# Patient Record
Sex: Female | Born: 1960 | Race: Black or African American | Hispanic: No | Marital: Married | Smoking: Never smoker
Health system: Southern US, Community
[De-identification: ages and names within clinical notes are randomized; demographics above are authoritative.]

## PROBLEM LIST (undated history)

## (undated) DIAGNOSIS — G5600 Carpal tunnel syndrome, unspecified upper limb: Secondary | ICD-10-CM

## (undated) DIAGNOSIS — E119 Type 2 diabetes mellitus without complications: Secondary | ICD-10-CM

## (undated) DIAGNOSIS — F419 Anxiety disorder, unspecified: Secondary | ICD-10-CM

## (undated) DIAGNOSIS — I10 Essential (primary) hypertension: Secondary | ICD-10-CM

## (undated) DIAGNOSIS — M1990 Unspecified osteoarthritis, unspecified site: Secondary | ICD-10-CM

## (undated) DIAGNOSIS — K219 Gastro-esophageal reflux disease without esophagitis: Secondary | ICD-10-CM

## (undated) HISTORY — PX: COLONOSCOPY: SHX174

## (undated) HISTORY — PX: JOINT REPLACEMENT: SHX530

## (undated) HISTORY — PX: ABDOMINAL HYSTERECTOMY: SHX81

## (undated) HISTORY — PX: DILATION AND CURETTAGE OF UTERUS: SHX78

---

## 1998-04-12 ENCOUNTER — Other Ambulatory Visit: Admission: RE | Admit: 1998-04-12 | Discharge: 1998-04-12 | Payer: Self-pay | Admitting: Obstetrics & Gynecology

## 1998-04-25 ENCOUNTER — Ambulatory Visit (HOSPITAL_COMMUNITY): Admission: RE | Admit: 1998-04-25 | Discharge: 1998-04-25 | Payer: Self-pay | Admitting: Family Medicine

## 1999-04-16 ENCOUNTER — Other Ambulatory Visit: Admission: RE | Admit: 1999-04-16 | Discharge: 1999-04-16 | Payer: Self-pay | Admitting: Obstetrics & Gynecology

## 2000-05-09 ENCOUNTER — Other Ambulatory Visit: Admission: RE | Admit: 2000-05-09 | Discharge: 2000-05-09 | Payer: Self-pay | Admitting: Obstetrics & Gynecology

## 2001-06-29 ENCOUNTER — Other Ambulatory Visit: Admission: RE | Admit: 2001-06-29 | Discharge: 2001-06-29 | Payer: Self-pay | Admitting: Obstetrics & Gynecology

## 2001-11-12 ENCOUNTER — Observation Stay (HOSPITAL_COMMUNITY): Admission: RE | Admit: 2001-11-12 | Discharge: 2001-11-14 | Payer: Self-pay | Admitting: Podiatry

## 2002-07-21 ENCOUNTER — Other Ambulatory Visit: Admission: RE | Admit: 2002-07-21 | Discharge: 2002-07-21 | Payer: Self-pay | Admitting: Obstetrics & Gynecology

## 2003-09-05 ENCOUNTER — Other Ambulatory Visit: Admission: RE | Admit: 2003-09-05 | Discharge: 2003-09-05 | Payer: Self-pay | Admitting: Obstetrics and Gynecology

## 2004-09-29 ENCOUNTER — Emergency Department (HOSPITAL_COMMUNITY): Admission: EM | Admit: 2004-09-29 | Discharge: 2004-09-29 | Payer: Self-pay | Admitting: Emergency Medicine

## 2004-10-04 ENCOUNTER — Other Ambulatory Visit: Admission: RE | Admit: 2004-10-04 | Discharge: 2004-10-04 | Payer: Self-pay | Admitting: Obstetrics & Gynecology

## 2005-10-23 ENCOUNTER — Other Ambulatory Visit: Admission: RE | Admit: 2005-10-23 | Discharge: 2005-10-23 | Payer: Self-pay | Admitting: Obstetrics & Gynecology

## 2014-02-19 ENCOUNTER — Encounter (HOSPITAL_COMMUNITY): Payer: Self-pay | Admitting: Emergency Medicine

## 2014-02-19 ENCOUNTER — Emergency Department (HOSPITAL_COMMUNITY)
Admission: EM | Admit: 2014-02-19 | Discharge: 2014-02-19 | Disposition: A | Discharge status: Home / Self Care | Payer: BC Managed Care – PPO | Attending: Emergency Medicine | Admitting: Emergency Medicine

## 2014-02-19 DIAGNOSIS — E119 Type 2 diabetes mellitus without complications: Secondary | ICD-10-CM | POA: Insufficient documentation

## 2014-02-19 DIAGNOSIS — Z79899 Other long term (current) drug therapy: Secondary | ICD-10-CM | POA: Insufficient documentation

## 2014-02-19 DIAGNOSIS — I10 Essential (primary) hypertension: Secondary | ICD-10-CM | POA: Insufficient documentation

## 2014-02-19 DIAGNOSIS — G5600 Carpal tunnel syndrome, unspecified upper limb: Secondary | ICD-10-CM | POA: Insufficient documentation

## 2014-02-19 DIAGNOSIS — R209 Unspecified disturbances of skin sensation: Secondary | ICD-10-CM | POA: Insufficient documentation

## 2014-02-19 DIAGNOSIS — M109 Gout, unspecified: Secondary | ICD-10-CM

## 2014-02-19 HISTORY — DX: Type 2 diabetes mellitus without complications: E11.9

## 2014-02-19 HISTORY — DX: Essential (primary) hypertension: I10

## 2014-02-19 HISTORY — DX: Carpal tunnel syndrome, unspecified upper limb: G56.00

## 2014-02-19 MED ORDER — PREDNISONE 20 MG PO TABS: 40.0000 mg | ORAL_TABLET | Freq: Every day | ORAL | Status: DC

## 2014-02-19 MED ORDER — HYDROCODONE-ACETAMINOPHEN 5-325 MG PO TABS
ORAL_TABLET | ORAL | Status: DC
Start: 2014-02-19 — End: 2017-04-02

## 2014-02-19 MED ORDER — KETOROLAC TROMETHAMINE 30 MG/ML IJ SOLN
30.0000 mg | Freq: Once | INTRAMUSCULAR | Status: AC
  Administered 2014-02-19: 30 mg via INTRAMUSCULAR
  Filled 2014-02-19: qty 1

## 2014-02-19 NOTE — ED Notes (Signed)
Pt from home c/o right hand/wrist pain that began this am that woke her from her sleep. HX of carpal tunnel. She reports that she can't make a fist. Family HX of gout.

## 2014-02-19 NOTE — Discharge Instructions (Signed)
Narcotic and benzodiazepine use may cause drowsiness, slowed breathing or dependence.  Please use with caution and do not drive, operate machinery or watch young children alone while taking them.  Taking combinations of these medications or drinking alcohol will potentiate these effects.      Prednisone is a steroid which can elevated blood sugars transiently.  If you have diabetes, please be sure to monitor your blood sugars more frequently and discontinue use if blood sugars begin to rise and is not controlled by your medications and contact your physician.

## 2014-02-19 NOTE — ED Provider Notes (Signed)
CSN: [id number]     Arrival date & time 02/19/14  0736 History   First MD Initiated Contact with Patient 02/19/14 [phone]     Chief Complaint  Patient presents with  . Hand Pain     (Consider location/radiation/quality/duration/timing/severity/associated sxs/prior Treatment) HPI Comments: Pt's sister and mother have had gout as well.  Pain was rather sudden that woke her around 0100.  She is right handed, doesn't recall any specific injuries.  She works with right hand a lot however, typing and has had carpal tunnel in the past, has been wearing her brace this AM with no relief.    Patient is a 53 y.o. female presenting with hand pain. The history is provided by the patient.  Hand Pain This is a new problem. The current episode started 6 to 12 hours ago. The problem occurs constantly. The problem has not changed since onset.The symptoms are aggravated by bending (movement, palpation, light touch). Nothing relieves the symptoms. Treatments tried: NSAIDs, Flexeril. The treatment provided no relief.    Past Medical History  Diagnosis Date  . Carpal tunnel syndrome   . Diabetes mellitus without complication   . Hypertension    Past Surgical History  Procedure Laterality Date  . Abdominal hysterectomy    . Cesarean section    . Dilation and curettage of uterus     History reviewed. No pertinent family history. History  Substance Use Topics  . Smoking status: Never Smoker   . Smokeless tobacco: Not on file  . Alcohol Use: 1.8 oz/week    3 Glasses of wine per week     Comment: everyday   OB History   Grav Para Term Preterm Abortions TAB SAB Ect Mult Living                 Review of Systems  Constitutional: Negative for fever and chills.  Musculoskeletal: Positive for arthralgias.  Skin: Negative for color change and wound.  Neurological: Positive for numbness. Negative for weakness.      Allergies  Lisinopril and Keflex  Home Medications   Current Outpatient Rx  Name   Route  Sig  Dispense  Refill  . ALPRAZolam (XANAX) 0.5 MG tablet   Oral   Take 0.5 mg by mouth daily as needed for anxiety.         Marland Kitchen amLODipine (NORVASC) 10 MG tablet   Oral   Take 10 mg by mouth daily.         . Cholecalciferol (VITAMIN D3) 5000 UNITS CAPS   Oral   Take 1 capsule by mouth daily.         . cyclobenzaprine (FLEXERIL) 10 MG tablet   Oral   Take 10 mg by mouth 3 (three) times daily as needed for muscle spasms.         Marland Kitchen estradiol (ESTRACE) 2 MG tablet   Oral   Take 2 mg by mouth daily.         . hydrochlorothiazide (HYDRODIURIL) 25 MG tablet   Oral   Take 25 mg by mouth daily.         . metFORMIN (GLUCOPHAGE) 500 MG tablet   Oral   Take 500 mg by mouth daily with breakfast.         . Multiple Vitamin (MULTIVITAMIN WITH MINERALS) TABS tablet   Oral   Take 2 tablets by mouth daily.         . vitamin B-12 (CYANOCOBALAMIN) 1000 MCG tablet   Oral  Take 1,000 mcg by mouth daily.         Marland Kitchen. HYDROcodone-acetaminophen (NORCO/VICODIN) 5-325 MG per tablet      1-2 tablets po q 6 hours prn moderate to severe pain   20 tablet   0   . predniSONE (DELTASONE) 20 MG tablet   Oral   Take 2 tablets (40 mg total) by mouth daily.   10 tablet   0    BP 141/90  Pulse 95  Temp(Src) 98.8 F (37.1 C) (Oral)  Resp 16  SpO2 100% Physical Exam  Nursing note and vitals reviewed. Constitutional: She appears well-developed and well-nourished.  Eyes: Conjunctivae are normal. No scleral icterus.  Cardiovascular:  Pulses:      Radial pulses are 2+ on the right side, and 2+ on the left side.  Musculoskeletal:       Right wrist: She exhibits decreased range of motion, tenderness, bony tenderness and swelling. She exhibits no effusion, no crepitus, no deformity and no laceration.       Hands: Neurological: She is alert.  Skin: Skin is warm. No rash noted. No pallor.  Psychiatric: She has a normal mood and affect.    ED Course  Procedures (including  critical care time) Labs Review Labs Reviewed - No data to display Imaging Review No results found.   EKG Interpretation None       RA sat is 100% and I interpret to be normal   MDM   Final diagnoses:  Gout of hand    Pt with rather acute onset of pain with strong family h/o gout in women with atraumatic pain.  Good pulses, mild subjective paresthesias along index finger only.  Will treat with NSAID's here, pt drove, will give Rx for steroids and analgesics at home.     Pt initially informed me she had no PMH other than carpal tunnel.  Pt takes norvasc and metformin, and does have type 2 DM.  Pt is instructed that prednisone can cause transient elevation in glucose's and to discontinue use if glucose is harder to control   Gavin PoundMichael Y. Oletta LamasGhim, MD 02/19/14 [phone]

## 2014-08-18 ENCOUNTER — Other Ambulatory Visit: Payer: Self-pay | Admitting: Obstetrics & Gynecology

## 2014-08-19 LAB — CYTOLOGY - PAP

## 2015-08-21 ENCOUNTER — Other Ambulatory Visit: Payer: Self-pay | Admitting: Obstetrics & Gynecology

## 2015-08-22 LAB — CYTOLOGY - PAP

## 2017-04-02 ENCOUNTER — Ambulatory Visit (INDEPENDENT_AMBULATORY_CARE_PROVIDER_SITE_OTHER): Payer: Self-pay

## 2017-04-02 ENCOUNTER — Encounter (INDEPENDENT_AMBULATORY_CARE_PROVIDER_SITE_OTHER): Payer: Self-pay | Admitting: Orthopedic Surgery

## 2017-04-02 ENCOUNTER — Ambulatory Visit (INDEPENDENT_AMBULATORY_CARE_PROVIDER_SITE_OTHER): Payer: BLUE CROSS/BLUE SHIELD | Admitting: Orthopedic Surgery

## 2017-04-02 DIAGNOSIS — G8929 Other chronic pain: Secondary | ICD-10-CM

## 2017-04-02 DIAGNOSIS — M25562 Pain in left knee: Secondary | ICD-10-CM

## 2017-04-02 MED ORDER — METHYLPREDNISOLONE ACETATE 40 MG/ML IJ SUSP
40.0000 mg | INTRAMUSCULAR | Status: AC | PRN
  Administered 2017-04-02: 40 mg via INTRA_ARTICULAR

## 2017-04-02 MED ORDER — LIDOCAINE HCL 1 % IJ SOLN
5.0000 mL | INTRAMUSCULAR | Status: AC | PRN
  Administered 2017-04-02: 5 mL

## 2017-04-02 MED ORDER — BUPIVACAINE HCL 0.25 % IJ SOLN
4.0000 mL | INTRAMUSCULAR | Status: AC | PRN
  Administered 2017-04-02: 4 mL via INTRA_ARTICULAR

## 2017-04-02 NOTE — Progress Notes (Signed)
Office Visit Note   Patient: Raven Shaw           Date of Birth: 08-29-1961           MRN: [id number] Visit Date: 04/02/2017 Requested by: Farris Has, MD [address] [place], [place] [address] PCP: Farris Has, MD  Subjective: Chief Complaint  Patient presents with  . 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  She reports about a month of pain with no discrete injury.  She's had injections in the past.  She denies any significant mechanical symptoms but does report swelling.  The pain will occasionally wake her from sleep at night.  She wants to try to do some exercises.  She's been taking Tylenol, Tylenol arthritis, as well as other anti-inflammatories with some relief.              ROS: All systems reviewed are negative as they relate to the chief complaint within the history of present illness.  Patient denies  fevers or chills.   Assessment & Plan: Visit Diagnoses:  1. Chronic pain of left knee     Plan: Impression is left knee arthritis flare with effusion.  Plan is aspiration and injection with nonweightbearing quad strengthening exercises.  She may come to knee replacement in the future but for now I think this is an exacerbation of her symptoms.  I think occasional anti-inflammatories are indicated.  I will see her back as needed.  Follow-Up Instructions: No Follow-up on file.   Orders:  Orders Placed This Encounter  Procedures  . XR KNEE 3 VIEW LEFT   No orders of the defined types were placed in this encounter.     Procedures: Large Joint Inj Date/Time: 04/02/2017 3:28 PM Performed by: Cammy Copa Authorized by: Cammy Copa   Consent Given by:  Patient Site marked: the procedure site was marked   Timeout: prior to procedure the correct patient, procedure, and site was verified   Indications:  Pain, joint swelling and diagnostic evaluation Location:  Knee Site:  L  knee Prep: patient was prepped and draped in usual sterile fashion   Needle Size:  18 G Needle Length:  1.5 inches Approach:  Superolateral Ultrasound Guidance: No   Fluoroscopic Guidance: No   Arthrogram: No   Medications:  5 mL lidocaine 1 %; 4 mL bupivacaine 0.25 %; 40 mg methylPREDNISolone acetate 40 MG/ML Aspirate amount (mL):  30 Aspirate:  Yellow Patient tolerance:  Patient tolerated the procedure well with no immediate complications     Clinical Data: No additional findings.  Objective: Vital Signs: There were no vitals taken for this visit.  Physical Exam:   Constitutional: Patient appears well-developed HEENT:  Head: Normocephalic Eyes:EOM are normal Neck: Normal range of motion Cardiovascular: Normal rate Pulmonary/chest: Effort normal Neurologic: Patient is alert Skin: Skin is warm Psychiatric: Patient has normal mood and affect    Ortho Exam: Orthopedic exam demonstrates slightly antalgic gait to the left.  Patient has moderate effusion in the left knee.  Pedal pulses palpable.  Alignment is intact.  No groin pain with internal/external rotation leg.  No other masses lymph adenopathy or skin changes noted in the left knee region.  There is lateral greater than medial joint line tenderness.  Also patient has fairly significant patellofemoral crepitus more on the left than the right  Specialty Comments:  No specialty comments available.  Imaging: Xr Knee  3 View Left  Result Date: 04/02/2017 AP lateral merchant view left knee reviewed.  Patellofemoral arthritis with medial facet spurring is noted on the merchant view.  Lateral patellofemoral joint space is maintained.  On the lateral view there is spurring on the posterior aspect of the lateral femoral condyle along with possible ossified loose body posteriorly.  Patellofemoral spurring also noted.  On the AP view there is spurring medially and laterally with some joint space narrowing on that lateral side more  on the left knee than the right    PMFS History: There are no active problems to display for this patient.  Past Medical History:  Diagnosis Date  . Carpal tunnel syndrome   . Diabetes mellitus without complication (HCC)   . Hypertension     No family history on file.  Past Surgical History:  Procedure Laterality Date  . ABDOMINAL HYSTERECTOMY    . CESAREAN SECTION    . DILATION AND CURETTAGE OF UTERUS     Social History   Occupational History  . Not on file.   Social History Main Topics  . Smoking status: Never Smoker  . Smokeless tobacco: Never Used  . Alcohol use 1.8 oz/week    3 Glasses of wine per week     Comment: everyday  . Drug use: Unknown  . Sexual activity: Not on file

## 2017-04-03 ENCOUNTER — Telehealth (INDEPENDENT_AMBULATORY_CARE_PROVIDER_SITE_OTHER): Payer: Self-pay

## 2017-04-03 NOTE — Telephone Encounter (Signed)
LM for patient to call and schedule appt for synvisc injection with Dr August Saucer

## 2017-04-08 ENCOUNTER — Telehealth (INDEPENDENT_AMBULATORY_CARE_PROVIDER_SITE_OTHER): Payer: Self-pay | Admitting: Orthopedic Surgery

## 2017-04-08 NOTE — Telephone Encounter (Signed)
Patient called advised her Lt knee is still hurting and want to know if she can get the Gel injection as soon as possible. The number to contact patient is [phone]

## 2017-04-09 ENCOUNTER — Encounter (INDEPENDENT_AMBULATORY_CARE_PROVIDER_SITE_OTHER): Payer: Self-pay | Admitting: Orthopedic Surgery

## 2017-04-09 ENCOUNTER — Ambulatory Visit (INDEPENDENT_AMBULATORY_CARE_PROVIDER_SITE_OTHER): Payer: BLUE CROSS/BLUE SHIELD | Admitting: Orthopedic Surgery

## 2017-04-09 DIAGNOSIS — M1712 Unilateral primary osteoarthritis, left knee: Secondary | ICD-10-CM | POA: Diagnosis not present

## 2017-04-09 MED ORDER — LIDOCAINE HCL 1 % IJ SOLN
5.0000 mL | INTRAMUSCULAR | Status: AC | PRN
  Administered 2017-04-09: 5 mL

## 2017-04-09 MED ORDER — HYLAN G-F 20 48 MG/6ML IX SOSY
48.0000 mg | PREFILLED_SYRINGE | INTRA_ARTICULAR | Status: AC | PRN
  Administered 2017-04-09: 48 mg via INTRA_ARTICULAR

## 2017-04-09 NOTE — Telephone Encounter (Signed)
APPT SCHEDULED

## 2017-04-09 NOTE — Progress Notes (Signed)
   Procedure Note  Patient: Raven Shaw             Date of Birth: 10-04-1961           MRN: [id number]             Visit Date: 04/09/2017  Procedures: Visit Diagnoses: Unilateral primary osteoarthritis, left knee  Large Joint Inj Date/Time: 04/09/2017 12:23 PM Performed by: Cammy Copa Authorized by: Cammy Copa   Consent Given by:  Patient Site marked: the procedure site was marked   Timeout: prior to procedure the correct patient, procedure, and site was verified   Indications:  Pain, joint swelling and diagnostic evaluation Location:  Knee Site:  L knee Prep: patient was prepped and draped in usual sterile fashion   Needle Size:  18 G Needle Length:  1.5 inches Approach:  Superolateral Ultrasound Guidance: No   Fluoroscopic Guidance: No   Arthrogram: No   Medications:  5 mL lidocaine 1 %; 48 mg Hylan 48 MG/6ML Aspiration Attempted: Yes   Aspirate amount (mL):  25 Aspirate:  Yellow Patient tolerance:  Patient tolerated the procedure well with no immediate complications

## 2017-04-22 ENCOUNTER — Ambulatory Visit (INDEPENDENT_AMBULATORY_CARE_PROVIDER_SITE_OTHER): Payer: BLUE CROSS/BLUE SHIELD

## 2017-04-22 ENCOUNTER — Encounter (HOSPITAL_COMMUNITY): Payer: Self-pay | Admitting: Family Medicine

## 2017-04-22 ENCOUNTER — Ambulatory Visit (HOSPITAL_COMMUNITY)
Admission: EM | Admit: 2017-04-22 | Discharge: 2017-04-22 | Disposition: A | Discharge status: Home / Self Care | Payer: BLUE CROSS/BLUE SHIELD | Attending: Internal Medicine | Admitting: Internal Medicine

## 2017-04-22 DIAGNOSIS — M931 Kienbock's disease of adults: Secondary | ICD-10-CM

## 2017-04-22 DIAGNOSIS — M25531 Pain in right wrist: Secondary | ICD-10-CM

## 2017-04-22 MED ORDER — KETOROLAC TROMETHAMINE 30 MG/ML IJ SOLN
INTRAMUSCULAR | Status: AC
  Filled 2017-04-22: qty 1

## 2017-04-22 MED ORDER — IBUPROFEN 800 MG PO TABS: 800.0000 mg | ORAL_TABLET | Freq: Three times a day (TID) | ORAL | 0 refills | Status: DC

## 2017-04-22 MED ORDER — DEXAMETHASONE SODIUM PHOSPHATE 10 MG/ML IJ SOLN
INTRAMUSCULAR | Status: AC
  Filled 2017-04-22: qty 1

## 2017-04-22 MED ORDER — DEXAMETHASONE SODIUM PHOSPHATE 10 MG/ML IJ SOLN
10.0000 mg | Freq: Once | INTRAMUSCULAR | Status: AC
  Administered 2017-04-22: 10 mg via INTRAMUSCULAR

## 2017-04-22 MED ORDER — KETOROLAC TROMETHAMINE 30 MG/ML IJ SOLN
30.0000 mg | Freq: Once | INTRAMUSCULAR | Status: AC
  Administered 2017-04-22: 30 mg via INTRAMUSCULAR

## 2017-04-22 NOTE — ED Triage Notes (Signed)
Pt here for right hand pain. Hx of gout. Denies injury

## 2017-04-22 NOTE — ED Provider Notes (Signed)
CSN: [id number]     Arrival date & time 04/22/17  1205 History   None    Chief Complaint  Patient presents with  . Hand Pain   (Consider location/radiation/quality/duration/timing/severity/associated sxs/prior Treatment) 56 year old female presents to clinic for evaluation of right hand pain. She is right hand dominant, states she has had worsening pain over the last 2-3 days. Pain is above the ulnar joint, and is worse with flexion of the fingers. Does have a past history of carpal tunnel, and of gout. She is tried over-the-counter medicine without relief. She has had no constitutional symptoms such as fever, chills, or loss of appetite. Denies any history of trauma to the area, she has not fallen, reports she has not lifted anything heavy. She does work as a Diplomatic Services operational officersecretary, and does type, and has a past history of carpal tunnel syndrome.   The history is provided by the patient.  Hand Pain  This is a new problem.    Past Medical History:  Diagnosis Date  . Carpal tunnel syndrome   . Diabetes mellitus without complication (HCC)   . Hypertension    Past Surgical History:  Procedure Laterality Date  . ABDOMINAL HYSTERECTOMY    . CESAREAN SECTION    . DILATION AND CURETTAGE OF UTERUS     History reviewed. No pertinent family history. Social History  Substance Use Topics  . Smoking status: Never Smoker  . Smokeless tobacco: Never Used  . Alcohol use 1.8 oz/week    3 Glasses of wine per week     Comment: everyday   OB History    No data available     Review of Systems  Constitutional: Negative for appetite change, chills and fever.  Respiratory: Negative.   Cardiovascular: Negative.   Gastrointestinal: Negative.   Musculoskeletal: Positive for joint swelling.  Skin: Negative.   Neurological: Negative.     Allergies  Lisinopril and Keflex [cephalexin]  Home Medications   Prior to Admission medications   Medication Sig Start Date End Date Taking? Authorizing Provider   ALPRAZolam Prudy Feeler(XANAX) 0.5 MG tablet Take 0.5 mg by mouth 3 (three) times daily as needed. 03/17/17   [provider]  amLODipine (NORVASC) 10 MG tablet Take 10 mg by mouth daily. 03/05/17   [provider]  cyclobenzaprine (FLEXERIL) 10 MG tablet TAKE 1 TABLET BY MOUTH 3 TIMES A DAY AS NEEDED FOR MUSCLE SPASMS (MUST LAST 90 DAYS PER DR) 02/06/17   [provider]  estradiol (ESTRACE) 2 MG tablet Take 2 mg by mouth daily. 01/15/17   [provider]  FLUoxetine (PROZAC) 20 MG capsule Take 20 mg by mouth daily. 03/13/17   [provider]  ibuprofen (ADVIL,MOTRIN) 800 MG tablet Take 1 tablet (800 mg total) by mouth 3 (three) times daily. 04/22/17   Dorena BodoKennard, Kalden Wanke, NP  losartan (COZAAR) 100 MG tablet Take 100 mg by mouth daily. 03/26/17   [provider]  losartan (COZAAR) 50 MG tablet Take 50 mg by mouth daily. 02/24/17   [provider]  meloxicam (MOBIC) 15 MG tablet Take 15 mg by mouth daily as needed. 01/09/17   [provider]  metFORMIN (GLUCOPHAGE) 500 MG tablet Take 500 mg by mouth daily. 03/06/17   [provider]  omeprazole (PRILOSEC) 20 MG capsule TAKE 1 TABLET BY MOUTH ONCE A DAY FOR 90 DAYS 02/22/17   [provider]   Meds Ordered and Administered this Visit   Medications  ketorolac (TORADOL) 30 MG/ML injection 30 mg (30  mg Intramuscular Given 04/22/17 1352)  dexamethasone (DECADRON) injection 10 mg (10 mg Intramuscular Given 04/22/17 1351)    BP (!) 151/75   Pulse 96   Temp 98.5 F (36.9 C)   Resp 16   SpO2 100%  No data found.   Physical Exam  Constitutional: She is oriented to person, place, and time. She appears well-developed and well-nourished. No distress.  HENT:  Head: Normocephalic and atraumatic.  Right Ear: External ear normal.  Left Ear: External ear normal.  Eyes: Conjunctivae are normal.  Neck: Normal range of motion.  Musculoskeletal:       Right wrist: She exhibits tenderness and  swelling.       Right hand: She exhibits decreased range of motion, tenderness and swelling. She exhibits normal capillary refill and no deformity. Normal sensation noted.  Neurological: She is alert and oriented to person, place, and time.  Skin: Skin is warm and dry. Capillary refill takes less than 2 seconds. No rash noted. She is not diaphoretic. No erythema.  Psychiatric: She has a normal mood and affect. Her behavior is normal.  Nursing note and vitals reviewed.   Urgent Care Course     Procedures (including critical care time)  Labs Review Labs Reviewed - No data to display  Imaging Review Dg Wrist Complete Right  Result Date: 04/22/2017 CLINICAL DATA:  Wrist pain several days, no known injury, initial encounter EXAM: RIGHT WRIST - COMPLETE 3+ VIEW COMPARISON:  None. FINDINGS: No acute fracture or dislocation is identified. Some increased sclerosis of the lunate bone is noted with some cystic changes as well as some small bony fragments in the posterior soft tissues. It Would be difficult to exclude the early changes of avascular necrosis in the lunate bone. No other focal abnormality is noted. IMPRESSION: Changes suggestive of early avascular necrosis in the lunate bone with some fragmentation posteriorly. No other focal abnormality is noted. Electronically Signed   By: Alcide Clever M.D.   On: 04/22/2017 14:17        MDM   1. Kienbock's avascular necrosis of lunate, adult    Discussed case with Dr. Amanda Pea, recommend rest, ice, splinting of the wrist, counseling provided to the patient, and referred to Dr. Carlos Levering office. Started on ibuprofen 800 mg for pain.    Dorena Bodo, NP 04/22/17 [phone]

## 2017-04-22 NOTE — Discharge Instructions (Signed)
You have a condition called Kienbock's disease, I have spoken with Dr. Amanda PeaGramig, the on-call hand surgeon. He has agreed to take it as a patient. There are 2 options available for you, the first is go to his office at 8 AM Monday morning. If this will not work for you, call his office as soon as possible to schedule appointment. Per his instructions, let the staff know that he and I have talked, and he has agreed to take you on as a patient. You will need to have an MRI done to stage this disease. We have placed your arm in a wrist splint, I recommend rest, ice, elevation, and I have called in some high-dose anti-inflammatories for you.

## 2017-05-20 ENCOUNTER — Encounter (INDEPENDENT_AMBULATORY_CARE_PROVIDER_SITE_OTHER): Payer: Self-pay | Admitting: Orthopedic Surgery

## 2017-05-20 ENCOUNTER — Ambulatory Visit (INDEPENDENT_AMBULATORY_CARE_PROVIDER_SITE_OTHER): Payer: BLUE CROSS/BLUE SHIELD | Admitting: Orthopedic Surgery

## 2017-05-20 DIAGNOSIS — M25562 Pain in left knee: Secondary | ICD-10-CM | POA: Diagnosis not present

## 2017-05-20 DIAGNOSIS — G8929 Other chronic pain: Secondary | ICD-10-CM

## 2017-05-23 DIAGNOSIS — M25562 Pain in left knee: Secondary | ICD-10-CM | POA: Diagnosis not present

## 2017-05-23 MED ORDER — LIDOCAINE HCL 1 % IJ SOLN
5.0000 mL | INTRAMUSCULAR | Status: AC | PRN
  Administered 2017-05-23: 5 mL

## 2017-05-23 MED ORDER — BUPIVACAINE HCL 0.25 % IJ SOLN
4.0000 mL | INTRAMUSCULAR | Status: AC | PRN
  Administered 2017-05-23: 4 mL via INTRA_ARTICULAR

## 2017-05-23 MED ORDER — METHYLPREDNISOLONE ACETATE 40 MG/ML IJ SUSP
40.0000 mg | INTRAMUSCULAR | Status: AC | PRN
  Administered 2017-05-23: 40 mg via INTRA_ARTICULAR

## 2017-05-23 NOTE — Progress Notes (Signed)
Office Visit Note   Patient: Raven Shaw           Date of Birth: 11-Dec-1960           MRN: [id number] Visit Date: 05/20/2017 Requested by: Farris Has, MD [address] [place], [place] [address] PCP: Farris Has, MD  Subjective: Chief Complaint  Patient presents with  . Left Knee - Follow-up    HPI:  This year.  That has helped her symptoms but her pain is recurred.  She has known degenerative arthritis in the knee.  She denies any mechanical symptoms.  She's taking over-the-counter medications with some relief.              ROS: All systems reviewed are negative as they relate to the chief complaint within the history of present illness.  Patient denies  fevers or chills.   Assessment & Plan: Visit Diagnoses:  1. Chronic pain of left knee     Plan:   Impression is left knee pain with effusion.  Symptomatically arthritis is present.  Patient has good quad strength and normal body mass index.  We will aspirate the knee today and injected.  Try to get her feeling somewhat better for her vacation.  I'll see her back as needed  Follow-Up Instructions: Return if symptoms worsen or fail to improve.   Orders:  No orders of the defined types were placed in this encounter.  No orders of the defined types were placed in this encounter.     Procedures: Large Joint Inj Date/Time: 05/23/2017 11:21 PM Performed by: Cammy Copa Authorized by: Cammy Copa   Consent Given by:  Patient Site marked: the procedure site was marked   Timeout: prior to procedure the correct patient, procedure, and site was verified   Indications:  Pain, joint swelling and diagnostic evaluation Location:  Knee Site:  L knee Prep: patient was prepped and draped in usual sterile fashion   Needle Size:  18 G Needle Length:  1.5 inches Approach:  Superolateral Ultrasound Guidance: No   Fluoroscopic Guidance: No   Arthrogram: No   Medications:  5 mL lidocaine 1 %; 4  mL bupivacaine 0.25 %; 40 mg methylPREDNISolone acetate 40 MG/ML Aspiration Attempted: Yes   Aspirate amount (mL):  30 Aspirate:  Yellow Patient tolerance:  Patient tolerated the procedure well with no immediate complications     Clinical Data: No additional findings.  Objective: Vital Signs: There were no vitals taken for this visit.  Physical Exam:   Constitutional: Patient appears well-developed HEENT:  Head: Normocephalic Eyes:EOM are normal Neck: Normal range of motion Cardiovascular: Normal rate Pulmonary/chest: Effort normal Neurologic: Patient is alert Skin: Skin is warm Psychiatric: Patient has normal mood and affect    Ortho Exam:  Orthopedic exam demonstrates slightly antalgic gait to the left intact extensor mechanism generally full range of motion with mild effusion present.  Pedal pulses palpable.  No groin pain with internal/external rotation leg.  No other masses lymph adenopathy or skin changes noted in the left knee region.  Specialty Comments:  No specialty comments available.  Imaging: No results found.   PMFS History: There are no active problems to display for this patient.  Past Medical History:  Diagnosis Date  . Carpal tunnel syndrome   . Diabetes mellitus without complication (HCC)   . Hypertension     No family history on file.  Past Surgical History:  Procedure Laterality Date  . ABDOMINAL HYSTERECTOMY    .  CESAREAN SECTION    . DILATION AND CURETTAGE OF UTERUS     Social History   Occupational History  . Not on file.   Social History Main Topics  . Smoking status: Never Smoker  . Smokeless tobacco: Never Used  . Alcohol use 1.8 oz/week    3 Glasses of wine per week     Comment: everyday  . Drug use: Unknown  . Sexual activity: Not on file

## 2017-09-04 ENCOUNTER — Encounter (INDEPENDENT_AMBULATORY_CARE_PROVIDER_SITE_OTHER): Payer: Self-pay | Admitting: Orthopedic Surgery

## 2017-09-04 ENCOUNTER — Ambulatory Visit (INDEPENDENT_AMBULATORY_CARE_PROVIDER_SITE_OTHER): Payer: BLUE CROSS/BLUE SHIELD | Admitting: Orthopedic Surgery

## 2017-09-04 DIAGNOSIS — M1712 Unilateral primary osteoarthritis, left knee: Secondary | ICD-10-CM

## 2017-09-04 MED ORDER — NABUMETONE 500 MG PO TABS: ORAL_TABLET | ORAL | 0 refills | Status: DC

## 2017-09-06 DIAGNOSIS — M1712 Unilateral primary osteoarthritis, left knee: Secondary | ICD-10-CM

## 2017-09-06 MED ORDER — BUPIVACAINE HCL 0.25 % IJ SOLN
4.0000 mL | INTRAMUSCULAR | Status: AC | PRN
  Administered 2017-09-06: 4 mL via INTRA_ARTICULAR

## 2017-09-06 MED ORDER — LIDOCAINE HCL 1 % IJ SOLN
5.0000 mL | INTRAMUSCULAR | Status: AC | PRN
  Administered 2017-09-06: 5 mL

## 2017-09-06 NOTE — Progress Notes (Signed)
Office Visit Note   Patient: Raven Shaw           Date of Birth: Jun 13, 1961           MRN: [id number] Visit Date: 09/04/2017 Requested by: Farris Has, MD [address] [place], [place] [address] PCP: Farris Has, MD  Subjective: Chief Complaint  Patient presents with  . Left Knee - Follow-up    HPI: [name] is a 56 year old patient with left knee pain.  She's here to follow-up on an injection done in June.  She did get some relief from that.  She had a flare of pain and was unable to weight-bear last week when she saw her primary care provider.  She has been on medical.  She denies any mechanical symptoms.  She does have what appears to be some lateral compartment arthritis in the knee.                ROS: All systems reviewed are negative as they relate to the chief complaint within the history of present illness.  Patient denies  fevers or chills.   Assessment & Plan: Visit Diagnoses:  1. Unilateral primary osteoarthritis, left knee     Plan: Impression is acute arthritis flare left knee.  Plan is aspiration and injection of Toradol.  Were to change her over to Relafen and set up moving.  I'll see her back in about 2 or 3 months if she is not improving.  Follow-Up Instructions: Return if symptoms worsen or fail to improve.   Orders:  No orders of the defined types were placed in this encounter.  Meds ordered this encounter  Medications  . nabumetone (RELAFEN) 500 MG tablet    Sig: 1-2 po qd prn    Dispense:  45 tablet    Refill:  0      Procedures: Large Joint Inj Date/Time: 09/06/2017 11:43 AM Performed by: Cammy Copa Authorized by: Cammy Copa   Consent Given by:  Patient Site marked: the procedure site was marked   Timeout: prior to procedure the correct patient, procedure, and site was verified   Indications:  Pain, joint swelling and diagnostic evaluation Location:  Knee Site:  L knee Prep: patient was prepped and  draped in usual sterile fashion   Needle Size:  18 G Needle Length:  1.5 inches Approach:  Superolateral Ultrasound Guidance: No   Fluoroscopic Guidance: No   Arthrogram: No   Medications:  5 mL lidocaine 1 %; 4 mL bupivacaine 0.25 % Patient tolerance:  Patient tolerated the procedure well with no immediate complications   Toradol injected 30 mg  Clinical Data: No additional findings.  Objective: Vital Signs: There were no vitals taken for this visit.  Physical Exam:   Constitutional: Patient appears well-developed HEENT:  Head: Normocephalic Eyes:EOM are normal Neck: Normal range of motion Cardiovascular: Normal rate Pulmonary/chest: Effort normal Neurologic: Patient is alert Skin: Skin is warm Psychiatric: Patient has normal mood and affect    Ortho Exam: Orthopedic exam demonstrates slight valgus alignment left leg.  Pedal pulses palpable.  No groin pain with internal/external rotation of the leg.  No other masses lymph adenopathy or skin changes noted in the left knee region.  Trace effusion is present.  Medial and lateral joint space tenderness is noted  Specialty Comments:  No specialty comments available.  Imaging: No results found.   PMFS History: There are no active problems to display for this patient.  Past Medical History:  Diagnosis Date  . Carpal tunnel syndrome   . Diabetes mellitus without complication (HCC)   . Hypertension     No family history on file.  Past Surgical History:  Procedure Laterality Date  . ABDOMINAL HYSTERECTOMY    . CESAREAN SECTION    . DILATION AND CURETTAGE OF UTERUS     Social History   Occupational History  . Not on file.   Social History Main Topics  . Smoking status: Never Smoker  . Smokeless tobacco: Never Used  . Alcohol use 1.8 oz/week    3 Glasses of wine per week     Comment: everyday  . Drug use: Unknown  . Sexual activity: Not on file

## 2017-09-14 ENCOUNTER — Emergency Department (HOSPITAL_COMMUNITY)
Admission: EM | Admit: 2017-09-14 | Discharge: 2017-09-14 | Disposition: A | Discharge status: Home / Self Care | Payer: BLUE CROSS/BLUE SHIELD | Attending: Emergency Medicine | Admitting: Emergency Medicine

## 2017-09-14 ENCOUNTER — Encounter (HOSPITAL_COMMUNITY): Payer: Self-pay | Admitting: Emergency Medicine

## 2017-09-14 DIAGNOSIS — G8929 Other chronic pain: Secondary | ICD-10-CM

## 2017-09-14 DIAGNOSIS — Z7984 Long term (current) use of oral hypoglycemic drugs: Secondary | ICD-10-CM | POA: Diagnosis not present

## 2017-09-14 DIAGNOSIS — I10 Essential (primary) hypertension: Secondary | ICD-10-CM | POA: Diagnosis not present

## 2017-09-14 DIAGNOSIS — M25462 Effusion, left knee: Secondary | ICD-10-CM | POA: Diagnosis not present

## 2017-09-14 DIAGNOSIS — E119 Type 2 diabetes mellitus without complications: Secondary | ICD-10-CM | POA: Insufficient documentation

## 2017-09-14 DIAGNOSIS — Z79899 Other long term (current) drug therapy: Secondary | ICD-10-CM | POA: Insufficient documentation

## 2017-09-14 DIAGNOSIS — M25562 Pain in left knee: Secondary | ICD-10-CM | POA: Diagnosis not present

## 2017-09-14 MED ORDER — KETOROLAC TROMETHAMINE 60 MG/2ML IM SOLN
60.0000 mg | Freq: Once | INTRAMUSCULAR | Status: AC
  Administered 2017-09-14: 60 mg via INTRAMUSCULAR
  Filled 2017-09-14: qty 2

## 2017-09-14 MED ORDER — PREDNISONE 20 MG PO TABS
60.0000 mg | ORAL_TABLET | Freq: Once | ORAL | Status: AC
  Administered 2017-09-14: 60 mg via ORAL
  Filled 2017-09-14: qty 3

## 2017-09-14 MED ORDER — PREDNISONE 10 MG (21) PO TBPK: ORAL_TABLET | ORAL | 0 refills | Status: DC

## 2017-09-14 NOTE — ED Provider Notes (Signed)
[facility]-EMERGENCY DEPT Provider Note   CSN: [id number] Arrival date & time: 09/14/17  1026     History   Chief Complaint Chief Complaint  Patient presents with  . Knee Pain  . Ankle Pain    HPI Raven Shaw is a 56 y.o. female.  HPI   Raven Shaw is a 57 y.o. female, with a history of DM, HTN, and osteoarthritis of the left knee, presenting to the ED with left knee pain for the past several months. Patient has been evaluated multiple times by orthopedic surgeon, Dr. August Saucer, and diagnosed with osteoarthritis. She states the pain in left knee has been worsening since she last saw Dr. August Saucer on September 27. She presents to the ED due to this worsening pain as well as to ask if there is anything different we can do here. Pain is worse with movement and weightbearing. Worsens throughout the day. Also endorses some swelling to the knee. Pain is 5/10, aching, nonradiating. Denies fever/chills, numbness/tingling, weakness, recent trauma, increased warmth, erythema, or any other complaints.    Past Medical History:  Diagnosis Date  . Carpal tunnel syndrome   . Diabetes mellitus without complication (HCC)   . Hypertension     There are no active problems to display for this patient.   Past Surgical History:  Procedure Laterality Date  . ABDOMINAL HYSTERECTOMY    . CESAREAN SECTION    . DILATION AND CURETTAGE OF UTERUS      OB History    No data available       Home Medications    Prior to Admission medications   Medication Sig Start Date End Date Taking? Authorizing Provider  ALPRAZolam Prudy Feeler) 0.5 MG tablet Take 0.5 mg by mouth 3 (three) times daily as needed. 03/17/17   [provider]  amLODipine (NORVASC) 10 MG tablet Take 10 mg by mouth daily. 03/05/17   [provider]  cyclobenzaprine (FLEXERIL) 10 MG tablet TAKE 1 TABLET BY MOUTH 3 TIMES A DAY AS NEEDED FOR MUSCLE SPASMS (MUST LAST 90 DAYS PER DR) 02/06/17   [provider]  estradiol (ESTRACE)  2 MG tablet Take 2 mg by mouth daily. 01/15/17   [provider]  FLUoxetine (PROZAC) 20 MG capsule Take 20 mg by mouth daily. 03/13/17   [provider]  ibuprofen (ADVIL,MOTRIN) 800 MG tablet Take 1 tablet (800 mg total) by mouth 3 (three) times daily. 04/22/17   Dorena Bodo, NP  losartan (COZAAR) 100 MG tablet Take 100 mg by mouth daily. 03/26/17   [provider]  losartan (COZAAR) 50 MG tablet Take 50 mg by mouth daily. 02/24/17   [provider]  meloxicam (MOBIC) 15 MG tablet Take 15 mg by mouth daily as needed. 01/09/17   [provider]  metFORMIN (GLUCOPHAGE) 500 MG tablet Take 500 mg by mouth daily. 03/06/17   [provider]  nabumetone (RELAFEN) 500 MG tablet 1-2 po qd prn 09/04/17   Cammy Copa, MD  omeprazole (PRILOSEC) 20 MG capsule TAKE 1 TABLET BY MOUTH ONCE A DAY FOR 90 DAYS 02/22/17   [provider]  predniSONE (STERAPRED UNI-PAK 21 TAB) 10 MG (21) TBPK tablet Take 6 tabs ( ) on day 1, 5 tabs ( ) on day 2, 4 tabs ( ) on day 3, 3 tabs ( ) on day 4, 2 tabs ( ) on day 5, and 1 tab ( ) on day 6. 09/14/17   Jannetta Massey, Hillard Danker, PA-C    Family History History reviewed. No  pertinent family history.  Social History Social History  Substance Use Topics  . Smoking status: Never Smoker  . Smokeless tobacco: Never Used  . Alcohol use 1.8 oz/week    3 Glasses of wine per week     Comment: everyday     Allergies   Lisinopril and Keflex [cephalexin]   Review of Systems Review of Systems  Constitutional: Negative for chills and fever.  Musculoskeletal: Positive for arthralgias and joint swelling.  Neurological: Negative for weakness and numbness.     Physical Exam Updated Vital Signs BP (!) 144/94 (BP Location: Right Arm)   Pulse 93   Temp 98.2 F (36.8 C) (Oral)   Resp 14   Ht 5' 3.5" (1.613 m)   Wt 66.7 kg (147 lb)   SpO2 100%   BMI 25.63 kg/m   Physical Exam  Constitutional: She  appears well-developed and well-nourished. No distress.  HENT:  Head: Normocephalic and atraumatic.  Eyes: Conjunctivae are normal.  Neck: Neck supple.  Cardiovascular: Normal rate, regular rhythm and intact distal pulses.   Pulmonary/Chest: Effort normal.  Musculoskeletal: She exhibits edema. She exhibits no tenderness or deformity.  Some edema to the anterior left knee. No tenderness. Full ROM, but painful. Weightbearing without assistance. No increased warmth, erythema, or pain out of proportion.  Neurological: She is alert.  Noted sensory deficits to left lower extremity. Strength with flexion and extension at the left knee, left ankle, and left hip are 5/5. Antalgic gait, but ambulatory without assistance.  Skin: Skin is warm and dry. Capillary refill takes less than 2 seconds. She is not diaphoretic. No pallor.  Psychiatric: She has a normal mood and affect. Her behavior is normal.  Nursing note and vitals reviewed.    ED Treatments / Results  Labs (all labs ordered are listed, but only abnormal results are displayed) Labs Reviewed - No data to display  EKG  EKG Interpretation None       Radiology No results found.  Procedures Procedures (including critical care time)  Medications Ordered in ED Medications  ketorolac (TORADOL) injection 60 mg (60 mg Intramuscular Given 09/14/17 1230)  predniSONE (DELTASONE) tablet 60 mg (60 mg Oral Given 09/14/17 1230)     Initial Impression / Assessment and Plan / ED Course  I have reviewed the triage vital signs and the nursing notes.  Pertinent labs & imaging results that were available during my care of the patient were reviewed by me and considered in my medical decision making (see chart for details).     Patient presents with chronic left knee pain. Discussed expectations for osteoarthritis. Suspicion for septic arthritis is low. Patient allows range of motion. Knee is not erythematous, not tender to the touch, and has no  increased warmth. Patient is weightbearing. Patient was offered a knee sleeve, but declined. Discussed prednisone in the setting of diabetes. Shared decision-making was used. Patient agreed to the use of the prednisone. She will monitor her blood sugar closely. Orthopedic follow-up. The patient was given instructions for home care as well as return precautions. Patient voices understanding of these instructions, accepts the plan, and is comfortable with discharge.  Vitals:   09/14/17 1100 09/14/17 1233 09/14/17 1240  BP: (!) 144/94 (!) 148/104 (!) 147/84  Pulse: 93 93   Resp: 14 15   Temp: 98.2 F (36.8 C)    TempSrc: Oral    SpO2: 100% 100%   Weight: 66.7 kg (147 lb)    Height: 5' 3.5" (1.613 m)  Final Clinical Impressions(s) / ED Diagnoses   Final diagnoses:  Chronic pain of left knee    New Prescriptions Discharge Medication List as of 09/14/2017 12:24 PM    START taking these medications   Details  predniSONE (STERAPRED UNI-PAK 21 TAB) 10 MG (21) TBPK tablet Take 6 tabs ( ) on day 1, 5 tabs ( ) on day 2, 4 tabs ( ) on day 3, 3 tabs ( ) on day 4, 2 tabs ( ) on day 5, and 1 tab ( ) on day 6., Print         Anselm Pancoast, PA-C 09/16/17 1638    Rolan Bucco, MD 09/19/17 [phone]

## 2017-09-14 NOTE — Discharge Instructions (Signed)
You have been seen today for pain in the knee. You were given Toradol injection as well as a prescription for a short course of prednisone, a steroid. The prednisone may temporarily increase your blood sugar. Monitor your blood sugar closely. Please perform the exercises prescribed to you by your orthopedic surgeon. Stay off of the extremity and elevate it whenever possible, however, prolonged immobility can lead to stiffness. Please follow-up with the orthopedic surgeon on this matter.

## 2017-09-14 NOTE — ED Notes (Signed)
Bed: WTR8 Expected date:  Expected time:  Means of arrival:  Comments: 

## 2017-09-14 NOTE — ED Triage Notes (Signed)
Pt reports L knee and R foot pain for the past several months. Went to PCP and orthopedic MD for this. Had fluid drained from knee a few days ago, which helped temporarily. No acute injury. Has been diagnosed with arthritis.

## 2017-09-24 ENCOUNTER — Other Ambulatory Visit (INDEPENDENT_AMBULATORY_CARE_PROVIDER_SITE_OTHER): Payer: Self-pay | Admitting: Orthopedic Surgery

## 2017-09-24 NOTE — Telephone Encounter (Signed)
y

## 2017-09-24 NOTE — Telephone Encounter (Signed)
Ok to rf? 

## 2017-10-06 ENCOUNTER — Ambulatory Visit (INDEPENDENT_AMBULATORY_CARE_PROVIDER_SITE_OTHER): Payer: BLUE CROSS/BLUE SHIELD | Admitting: Orthopedic Surgery

## 2017-10-06 ENCOUNTER — Encounter (INDEPENDENT_AMBULATORY_CARE_PROVIDER_SITE_OTHER): Payer: Self-pay | Admitting: Orthopedic Surgery

## 2017-10-06 DIAGNOSIS — G8929 Other chronic pain: Secondary | ICD-10-CM

## 2017-10-06 DIAGNOSIS — M25562 Pain in left knee: Secondary | ICD-10-CM

## 2017-10-07 MED ORDER — BUPIVACAINE HCL 0.25 % IJ SOLN
4.0000 mL | INTRAMUSCULAR | Status: AC | PRN
  Administered 2017-10-07: 4 mL via INTRA_ARTICULAR

## 2017-10-07 MED ORDER — METHYLPREDNISOLONE ACETATE 40 MG/ML IJ SUSP
40.0000 mg | INTRAMUSCULAR | Status: AC | PRN
  Administered 2017-10-07: 40 mg via INTRA_ARTICULAR

## 2017-10-07 MED ORDER — LIDOCAINE HCL 1 % IJ SOLN
5.0000 mL | INTRAMUSCULAR | Status: AC | PRN
  Administered 2017-10-07: 5 mL

## 2017-10-07 NOTE — Progress Notes (Addendum)
Office Visit Note   Patient: Raven Shaw           Date of Birth: 06-May-1961           MRN: [id number] Visit Date: 10/06/2017 Requested by: Farris HasMorrow, Aaron, MD [address] [place], [place] [address] PCP: Farris HasMorrow, Aaron, MD  Subjective: Chief Complaint  Patient presents with  . Left Knee - Pain    HPI: Raven Shaw is a 56 year old patient with left knee pain..  She has been to the emergency room with pain.  It is painful for her to weight bear.  She walks a lot at work.  She went to the emergency room and received a shot and used a steroid pack.  She uses Motrin which is been changed to Relafen.  She's tried pain pills which have not been helpful.  Denies any mechanical symptoms but reports pain only right knee feels really good left knee is painful.  Left knee was injected in September and June.  Cannot              ROS: All systems reviewed are negative as they relate to the chief complaint within the history of present illness.  Patient denies  fevers or chills.   Assessment & Plan: Visit Diagnoses:  1. Chronic pain of left knee     Plan: Take impression is left knee pain possible arthritis versus chondral defect versus meniscal tear.  Radiographs do show some lateral joint space narrowing.  The real question here is whether or not there is anything arthroscopically treatable in this young patient who has knee pain and recurrent effusion.  Plan is aspiration and injection today.  Out of work for 7 days.  Continue Relafen.  Continue nonweightbearing quad strengthening exercises.  MRI scan of the left knee in January to answer the question is this arthritis or is there is something arthroscopically treatable in the knee which could delay her need for knee replacement surgery  Follow-Up Instructions: No Follow-up on file.   Orders:  Orders Placed This Encounter  Procedures  . MR Knee Left w/o contrast   No orders of the defined types were placed in this  encounter.     Procedures: Large Joint Inj Date/Time: 10/07/2017 10:56 PM Performed by: Cammy CopaEAN, Elsy Chiang SCOTT Authorized by: Cammy CopaEAN, Cong Hightower SCOTT   Consent Given by:  Patient Site marked: the procedure site was marked   Timeout: prior to procedure the correct patient, procedure, and site was verified   Indications:  Pain, joint swelling and diagnostic evaluation Location:  Knee Site:  L knee Prep: patient was prepped and draped in usual sterile fashion   Needle Size:  18 G Needle Length:  1.5 inches Approach:  Superolateral Ultrasound Guidance: No   Fluoroscopic Guidance: No   Arthrogram: No   Medications:  5 mL lidocaine 1 %; 4 mL bupivacaine 0.25 %; 40 mg methylPREDNISolone acetate 40 MG/ML Aspiration Attempted: Yes   Aspirate amount (mL):  20 Aspirate:  Yellow Patient tolerance:  Patient tolerated the procedure well with no immediate complications     Clinical Data: No additional findings.  Objective: Vital Signs: There were no vitals taken for this visit.  Physical Exam:   Constitutional: Patient appears well-developed HEENT:  Head: Normocephalic Eyes:EOM are normal Neck: Normal range of motion Cardiovascular: Normal rate Pulmonary/chest: Effort normal Neurologic: Patient is alert Skin: Skin is warm Psychiatric: Patient has normal mood and affect    Ortho Exam: Orthopedic exam demonstrates mild effusion left  knee no effusion right knee full range of motion.  Stable collateral crucial ligaments intact extensor mechanism palpable pedal pulses no other masses lymph adenopathy or skin changes noted in the left knee region  Specialty Comments:  No specialty comments available.  Imaging: No results found.   PMFS History: There are no active problems to display for this patient.  Past Medical History:  Diagnosis Date  . Carpal tunnel syndrome   . Diabetes mellitus without complication (HCC)   . Hypertension     No family history on file.  Past  Surgical History:  Procedure Laterality Date  . ABDOMINAL HYSTERECTOMY    . CESAREAN SECTION    . DILATION AND CURETTAGE OF UTERUS     Social History   Occupational History  . Not on file.   Social History Main Topics  . Smoking status: Never Smoker  . Smokeless tobacco: Never Used  . Alcohol use 1.8 oz/week    3 Glasses of wine per week     Comment: everyday  . Drug use: Unknown  . Sexual activity: Not on file

## 2017-10-14 ENCOUNTER — Other Ambulatory Visit (INDEPENDENT_AMBULATORY_CARE_PROVIDER_SITE_OTHER): Payer: Self-pay | Admitting: Orthopedic Surgery

## 2017-10-14 NOTE — Telephone Encounter (Signed)
Ok to rf? 

## 2017-10-14 NOTE — Telephone Encounter (Signed)
y

## 2017-11-03 ENCOUNTER — Other Ambulatory Visit (INDEPENDENT_AMBULATORY_CARE_PROVIDER_SITE_OTHER): Payer: Self-pay | Admitting: Orthopedic Surgery

## 2017-11-03 NOTE — Telephone Encounter (Signed)
Ok to rf? 

## 2017-11-03 NOTE — Telephone Encounter (Signed)
y

## 2017-12-13 ENCOUNTER — Other Ambulatory Visit (INDEPENDENT_AMBULATORY_CARE_PROVIDER_SITE_OTHER): Payer: Self-pay | Admitting: Orthopedic Surgery

## 2017-12-15 NOTE — Telephone Encounter (Signed)
Ok to rf? 

## 2017-12-15 NOTE — Telephone Encounter (Signed)
y

## 2017-12-23 ENCOUNTER — Telehealth (INDEPENDENT_AMBULATORY_CARE_PROVIDER_SITE_OTHER): Payer: Self-pay

## 2017-12-23 NOTE — Telephone Encounter (Signed)
Patient called concerning having an MRI done this month.  Stated that insurance has changed and would stop by our office with the new insurance card.

## 2017-12-24 ENCOUNTER — Telehealth (INDEPENDENT_AMBULATORY_CARE_PROVIDER_SITE_OTHER): Payer: Self-pay | Admitting: Orthopedic Surgery

## 2017-12-24 NOTE — Telephone Encounter (Signed)
FYI patient brought her new insurance in so you can check authorization for an MRI. I updated coverage and scanned in a copy. Thanks!

## 2017-12-29 NOTE — Telephone Encounter (Signed)
[facility] [place] [facility] and she will contact pt to schedule appt

## 2017-12-29 NOTE — Telephone Encounter (Signed)
Service Order: [id number] Case Status: Approved Authorization Number: [id number] Auth Effective Date: 12/29/2017 Auth End Date: 03/29/2018   Pt has new [employer]

## 2018-01-03 ENCOUNTER — Other Ambulatory Visit (INDEPENDENT_AMBULATORY_CARE_PROVIDER_SITE_OTHER): Payer: Self-pay | Admitting: Orthopedic Surgery

## 2018-01-04 ENCOUNTER — Ambulatory Visit
Admission: RE | Admit: 2018-01-04 | Discharge: 2018-01-04 | Disposition: A | Discharge status: Home / Self Care | Payer: 59 | Source: Ambulatory Visit | Attending: Orthopedic Surgery | Admitting: Orthopedic Surgery

## 2018-01-04 DIAGNOSIS — G8929 Other chronic pain: Secondary | ICD-10-CM

## 2018-01-04 DIAGNOSIS — M25562 Pain in left knee: Principal | ICD-10-CM

## 2018-01-05 NOTE — Telephone Encounter (Signed)
Ok to rf? 

## 2018-01-05 NOTE — Telephone Encounter (Signed)
y

## 2018-01-26 ENCOUNTER — Telehealth (INDEPENDENT_AMBULATORY_CARE_PROVIDER_SITE_OTHER): Payer: Self-pay | Admitting: Orthopedic Surgery

## 2018-01-26 NOTE — Telephone Encounter (Signed)
Please advise. Thanks.  

## 2018-01-26 NOTE — Telephone Encounter (Signed)
Patient called asking if she could get a call back with her MRI results. CB # [phone]

## 2018-01-27 NOTE — Telephone Encounter (Signed)
We shuld stay with otc for now pls call thx could add robaxin 500 po q 8 # 30

## 2018-01-27 NOTE — Telephone Encounter (Signed)
I called.  She will consider her options.  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  I would hesitate to put a lateral partial knee replacement in her because of that ACL mucoid degeneration.

## 2018-01-27 NOTE — Telephone Encounter (Signed)
Patient asking for MRI results.

## 2018-01-28 NOTE — Telephone Encounter (Signed)
Patient called stating that she is ready to move forward with the knee replacement.

## 2018-01-28 NOTE — Telephone Encounter (Signed)
done

## 2018-01-28 NOTE — Telephone Encounter (Signed)
Please fill out blue sheet so we can get patient scheduled. Thanks.

## 2018-01-28 NOTE — Telephone Encounter (Signed)
IC s/w patient and advised would be in touch with her to get surgery scheduled.

## 2018-02-10 ENCOUNTER — Other Ambulatory Visit (INDEPENDENT_AMBULATORY_CARE_PROVIDER_SITE_OTHER): Payer: Self-pay | Admitting: Orthopedic Surgery

## 2018-02-10 DIAGNOSIS — M1712 Unilateral primary osteoarthritis, left knee: Secondary | ICD-10-CM

## 2018-02-12 NOTE — Pre-Procedure Instructions (Signed)
Raven CabalDebra E Shaw  02/12/2018      [id number]- [place], [place] - [address] [place] [address] Phone: [phone] Fax: [phone]    Your procedure is scheduled on Tuesday March 19.  [facility] Admitting at 11:30 A.M.  Call this number if you have problems the morning of surgery:  [phone]   Remember:  Do not eat food or drink liquids after midnight.  Take these medicines the morning of surgery with A SIP OF WATER:   Amlodipine (norvasc) Metoprolol (Toprol-XL) Omeprazole (prilosec) Estradiol (estrace) Alprazolam (Xanax) if needed Acetaminophen (tylenol) if needed Cyclobenzaprine (flexeril) if needed Eye drops  DO NOT TAKE metformin (glucophage) the day of surgery  7 days prior to surgery STOP taking any Aspirin(unless otherwise instructed by your surgeon), nabumetone (relafen), Aleve, Naproxen, Ibuprofen, Motrin, Advil, Goody's, BC's, all herbal medications, fish oil, and all vitamins     How to Manage Your Diabetes Before and After Surgery  Why is it important to control my blood sugar before and after surgery? . Improving blood sugar levels before and after surgery helps healing and can limit problems. . A way of improving blood sugar control is eating a healthy diet by: o  Eating less sugar and carbohydrates o  Increasing activity/exercise o  Talking with your doctor about reaching your blood sugar goals . High blood sugars (greater than 180 mg/dL) can raise your risk of infections and slow your recovery, so you will need to focus on controlling your diabetes during the weeks before surgery. . Make sure that the doctor who takes care of your diabetes knows about your planned surgery including the date and location.  How do I manage my blood sugar before surgery? . Check your blood sugar at least 4 times a day, starting 2 days before surgery, to make sure that the level is not too high or  low. o Check your blood sugar the morning of your surgery when you wake up and every 2 hours until you get to the Short Stay unit. . If your blood sugar is less than 70 mg/dL, you will need to treat for low blood sugar: o Do not take insulin. o Treat a low blood sugar (less than 70 mg/dL) with  cup of clear juice (cranberry or apple), 4 glucose tablets, OR glucose gel. Recheck blood sugar in 15 minutes after treatment (to make sure it is greater than 70 mg/dL). If your blood sugar is not greater than 70 mg/dL on recheck, call [phone] o  for further instructions. . Report your blood sugar to the short stay nurse when you get to Short Stay.  . If you are admitted to the hospital after surgery: o Your blood sugar will be checked by the staff and you will probably be given insulin after surgery (instead of oral diabetes medicines) to make sure you have good blood sugar levels. o The goal for blood sugar control after surgery is 80-180 mg/dL.              Do not wear jewelry, make-up or nail polish.  Do not wear lotions, powders, or perfumes, or deodorant.  Do not shave 48 hours prior to surgery.  Men may shave face and neck.  Do not bring valuables to the hospital.  [facility] is not responsible for any belongings or valuables.  Contacts, dentures or bridgework may not be worn into surgery.  Leave your suitcase in the  car.  After surgery it may be brought to your room.  For patients admitted to the hospital, discharge time will be determined by your treatment team.  Patients discharged the day of surgery will not be allowed to drive home.   Special instructions:    [place]- Preparing For Surgery  Before surgery, you can play an important role. Because skin is not sterile, your skin needs to be as free of germs as possible. You can reduce the number of germs on your skin by washing with CHG (chlorahexidine gluconate) Soap before surgery.  CHG is an antiseptic cleaner  which kills germs and bonds with the skin to continue killing germs even after washing.  Please do not use if you have an allergy to CHG or antibacterial soaps. If your skin becomes reddened/irritated stop using the CHG.  Do not shave (including legs and underarms) for at least 48 hours prior to first CHG shower. It is OK to shave your face.  Please follow these instructions carefully.   1. Shower the NIGHT BEFORE SURGERY and the MORNING OF SURGERY with CHG.   2. If you chose to wash your hair, wash your hair first as usual with your normal shampoo.  3. After you shampoo, rinse your hair and body thoroughly to remove the shampoo.  4. Use CHG as you would any other liquid soap. You can apply CHG directly to the skin and wash gently with a scrungie or a clean washcloth.   5. Apply the CHG Soap to your body ONLY FROM THE NECK DOWN.  Do not use on open wounds or open sores. Avoid contact with your eyes, ears, mouth and genitals (private parts). Wash Face and genitals (private parts)  with your normal soap.  6. Wash thoroughly, paying special attention to the area where your surgery will be performed.  7. Thoroughly rinse your body with warm water from the neck down.  8. DO NOT shower/wash with your normal soap after using and rinsing off the CHG Soap.  9. Pat yourself dry with a CLEAN TOWEL.  10. Wear CLEAN PAJAMAS to bed the night before surgery, wear comfortable clothes the morning of surgery  11. Place CLEAN SHEETS on your bed the night of your first shower and DO NOT SLEEP WITH PETS.    Day of Surgery: Do not apply any deodorants/lotions. Please wear clean clothes to the hospital/surgery center.      Please read over the following fact sheets that you were given. Coughing and Deep Breathing, Total Joint Packet, MRSA Information and Surgical Site Infection Prevention

## 2018-02-13 ENCOUNTER — Other Ambulatory Visit: Payer: Self-pay

## 2018-02-13 ENCOUNTER — Other Ambulatory Visit (INDEPENDENT_AMBULATORY_CARE_PROVIDER_SITE_OTHER): Payer: Self-pay

## 2018-02-13 ENCOUNTER — Encounter (HOSPITAL_COMMUNITY): Payer: Self-pay

## 2018-02-13 ENCOUNTER — Encounter (HOSPITAL_COMMUNITY)
Admission: RE | Admit: 2018-02-13 | Discharge: 2018-02-13 | Disposition: A | Discharge status: Home / Self Care | Payer: Managed Care, Other (non HMO) | Source: Ambulatory Visit | Attending: Orthopedic Surgery | Admitting: Orthopedic Surgery

## 2018-02-13 DIAGNOSIS — Z01810 Encounter for preprocedural cardiovascular examination: Secondary | ICD-10-CM | POA: Insufficient documentation

## 2018-02-13 DIAGNOSIS — Z01812 Encounter for preprocedural laboratory examination: Secondary | ICD-10-CM | POA: Diagnosis present

## 2018-02-13 HISTORY — DX: Gastro-esophageal reflux disease without esophagitis: K21.9

## 2018-02-13 HISTORY — DX: Anxiety disorder, unspecified: F41.9

## 2018-02-13 HISTORY — DX: Unspecified osteoarthritis, unspecified site: M19.90

## 2018-02-13 LAB — COMPREHENSIVE METABOLIC PANEL
ALT: 20 U/L (ref 14–54)
AST: 28 U/L (ref 15–41)
Albumin: 4.1 g/dL (ref 3.5–5.0)
Alkaline Phosphatase: 48 U/L (ref 38–126)
Anion gap: 12 (ref 5–15)
BUN: 17 mg/dL (ref 6–20)
CHLORIDE: 102 mmol/L (ref 101–111)
CO2: 24 mmol/L (ref 22–32)
CREATININE: 1.02 mg/dL — AB (ref 0.44–1.00)
Calcium: 9.2 mg/dL (ref 8.9–10.3)
Glucose, Bld: 101 mg/dL — ABNORMAL HIGH (ref 65–99)
Potassium: 4.2 mmol/L (ref 3.5–5.1)
SODIUM: 138 mmol/L (ref 135–145)
Total Bilirubin: 0.4 mg/dL (ref 0.3–1.2)
Total Protein: 7 g/dL (ref 6.5–8.1)

## 2018-02-13 LAB — URINALYSIS, ROUTINE W REFLEX MICROSCOPIC
BILIRUBIN URINE: NEGATIVE
GLUCOSE, UA: NEGATIVE mg/dL
Hgb urine dipstick: NEGATIVE
KETONES UR: 5 mg/dL — AB
Leukocytes, UA: NEGATIVE
Nitrite: NEGATIVE
PH: 5 (ref 5.0–8.0)
Protein, ur: NEGATIVE mg/dL
Specific Gravity, Urine: 1.029 (ref 1.005–1.030)

## 2018-02-13 LAB — CBC
HCT: 38 % (ref 36.0–46.0)
Hemoglobin: 12.3 g/dL (ref 12.0–15.0)
MCH: 29.8 pg (ref 26.0–34.0)
MCHC: 32.4 g/dL (ref 30.0–36.0)
MCV: 92 fL (ref 78.0–100.0)
PLATELETS: 383 10*3/uL (ref 150–400)
RBC: 4.13 MIL/uL (ref 3.87–5.11)
RDW: 12.7 % (ref 11.5–15.5)
WBC: 7.8 10*3/uL (ref 4.0–10.5)

## 2018-02-13 LAB — SURGICAL PCR SCREEN
MRSA, PCR: NEGATIVE
Staphylococcus aureus: NEGATIVE

## 2018-02-13 LAB — TYPE AND SCREEN
ABO/RH(D): A POS
ANTIBODY SCREEN: NEGATIVE

## 2018-02-13 LAB — GLUCOSE, CAPILLARY: GLUCOSE-CAPILLARY: 132 mg/dL — AB (ref 65–99)

## 2018-02-13 LAB — ABO/RH: ABO/RH(D): A POS

## 2018-02-13 NOTE — Progress Notes (Signed)
PCP is Dr. Farris HasAaron Morrow Denies ever seeing a cardiologist. Denies chest pain, cough, or fever. Reports fasting CBG's run 112-126 Reports she stopped taking aspirin 02-07-18 Denies ever having a card cath, stress test, or echo.

## 2018-02-14 LAB — URINE CULTURE: CULTURE: NO GROWTH

## 2018-02-14 LAB — HEMOGLOBIN A1C
Hgb A1c MFr Bld: 6.1 % — ABNORMAL HIGH (ref 4.8–5.6)
MEAN PLASMA GLUCOSE: 128 mg/dL

## 2018-02-23 NOTE — H&P (Signed)
TOTAL KNEE ADMISSION H&P  Patient is being admitted for left total knee arthroplasty.  Subjective:  Chief Complaint:left knee pain.  HPI: Raven Shaw, 57 y.o. female, has a history of pain and functional disability in the left knee due to arthritis and has failed non-surgical conservative treatments for greater than 12 weeks to includeNSAID's and/or analgesics, corticosteriod injections, viscosupplementation injections and activity modification.  Onset of symptoms was gradual, starting 4 years ago with gradually worsening course since that time. The patient noted no past surgery on the left knee(s).  Patient currently rates pain in the left knee(s) at 8 out of 10 with activity. Patient has night pain, worsening of pain with activity and weight bearing, pain that interferes with activities of daily living, pain with passive range of motion, crepitus and joint swelling.  Patient has evidence of subchondral sclerosis and joint space narrowing by imaging studies. This patient has had MRI scan which shows severe lateral compartment arthritis along with severe mucoid degeneration of the ACL which would prohibit her from being a candidate for partial knee replacement. There is no active infection.  There are no active problems to display for this patient.  Past Medical History:  Diagnosis Date  . Anxiety   . Arthritis   . Carpal tunnel syndrome   . Diabetes mellitus without complication (HCC)   . GERD (gastroesophageal reflux disease)   . Hypertension     Past Surgical History:  Procedure Laterality Date  . ABDOMINAL HYSTERECTOMY    . CESAREAN SECTION    . COLONOSCOPY    . DILATION AND CURETTAGE OF UTERUS      No current facility-administered medications for this encounter.    Current Outpatient Medications  Medication Sig Dispense Refill Last Dose  . acetaminophen (TYLENOL) 500 MG tablet Take 500 mg by mouth 2 (two) times daily as needed for moderate pain.     Marland Kitchen acetaminophen (TYLENOL)  650 MG CR tablet Take 650-1,300 mg by mouth 2 (two) times daily as needed for pain.     Marland Kitchen ALPRAZolam (XANAX) 0.5 MG tablet Take 0.5 mg by mouth daily. May take a second 0.5 mg dose as needed for anxiety  1 Taking  . amLODipine (NORVASC) 10 MG tablet Take 10 mg by mouth daily.  1 Taking  . Cholecalciferol (VITAMIN D) 2000 units tablet Take 2,000 Units by mouth daily.     . Cyanocobalamin 1500 MCG TBDP Take 1,500 mcg by mouth daily.     . cyclobenzaprine (FLEXERIL) 10 MG tablet Take 10 mg by mouth up to 3 times daily as needed for muscle spasms  5 Taking  . estradiol (ESTRACE) 2 MG tablet Take 2 mg by mouth daily.  99 Taking  . FLUoxetine (PROZAC) 20 MG capsule Take 20 mg by mouth at bedtime.   99 Taking  . losartan (COZAAR) 50 MG tablet Take 50 mg by mouth daily.  0 Taking  . Menthol, Topical Analgesic, (ICY HOT EX) Apply 1 application topically daily as needed (knee pain).     . metFORMIN (GLUCOPHAGE) 500 MG tablet Take 250-500 mg by mouth See admin instructions. Take 500 mg in the morning and 250 mg at night  1 Taking  . metoprolol succinate (TOPROL-XL) 25 MG 24 hr tablet Take 25 mg by mouth daily.     . nabumetone (RELAFEN) 500 MG tablet TAKE 1 OR 2 TABLETS BY MOUTH EVERY DAY AS NEEDED (Patient taking differently: Take 500 mg by mouth twice daily as needed for pain) 45  tablet 0   . Omega-3 Fatty Acids (FISH OIL) 1000 MG CAPS Take 1,000 mg by mouth daily.     Marland Kitchen. omeprazole (PRILOSEC) 20 MG capsule TAKE 1 TABLET BY MOUTH ONCE A DAY FOR 90 DAYS  2 Taking  . Tetrahydrozoline HCl (VISINE OP) Place 2 drops into both eyes daily as needed (dry eyes).     Marland Kitchen. aspirin EC 81 MG tablet Take 81 mg by mouth daily.     . CONTOUR NEXT TEST test strip USE TO CHECK BLOOD SUGARS IN THE MORNING WHEN FASTING AND AS NEEDED  9   . predniSONE (STERAPRED UNI-PAK 21 TAB) 10 MG (21) TBPK tablet Take 6 tabs (60mg ) on day 1, 5 tabs (50mg ) on day 2, 4 tabs (40mg ) on day 3, 3 tabs (30mg ) on day 4, 2 tabs (20mg ) on day 5, and 1  tab (10mg ) on day 6. (Patient not taking: Reported on 02/10/2018) 21 tablet 0 Completed Course at Unknown time   Allergies  Allergen Reactions  . Ace Inhibitors Swelling  . Lisinopril Swelling and Other (See Comments)    angioedema  . Keflex [Cephalexin] Swelling and Rash    Social History   Tobacco Use  . Smoking status: Never Smoker  . Smokeless tobacco: Never Used  Substance Use Topics  . Alcohol use: Yes    Alcohol/week: 10.2 oz    Types: 7 Cans of beer, 10 Shots of liquor per week    Comment: 1-2 glasses week of wine    No family history on file.   ROS  Objective:  Physical Exam  Vital signs in last 24 hours:    Labs:   Estimated body mass index is 26.27 kg/m as calculated from the following:   Height as of 02/13/18: 5\' 3"  (1.6 m).   Weight as of 02/13/18: 148 lb 4.8 oz (67.3 kg).   Imaging Review Plain radiographs demonstrate moderate degenerative joint disease of the left knee(s). The overall alignment isneutral. The bone quality appears to be good for age and reported activity level.   Preoperative templating of the joint replacement has been completed, documented, and submitted to the Operating Room personnel in order to optimize intra-operative equipment management.   Anticipated LOS equal to or greater than 2 midnights due to - Age 465 and older with one or more of the following:  - Obesity  - Expected need for hospital services (PT, OT, Nursing) required for safe  discharge  - Anticipated need for postoperative skilled nursing care or inpatient rehab  - Active co-morbidities: None OR   - Unanticipated findings during/Post Surgery: None  - Patient is a high risk of re-admission due to: None     Assessment/Plan:  End stage arthritis, left knee   The patient history, physical examination, clinical judgment of the provider and imaging studies are consistent with end stage degenerative joint disease of the left knee(s) and total knee arthroplasty is  deemed medically necessary. The treatment options including medical management, injection therapy arthroscopy and arthroplasty were discussed at length. The risks and benefits of total knee arthroplasty were presented and reviewed. The risks due to aseptic loosening, infection, stiffness, patella tracking problems, thromboembolic complications and other imponderables were discussed. The patient acknowledged the explanation, agreed to proceed with the plan and consent was signed. Patient is being admitted for inpatient treatment for surgery, pain control, PT, OT, prophylactic antibiotics, VTE prophylaxis, progressive ambulation and ADL's and discharge planning. The patient is planning to be discharged home with home health  services

## 2018-02-24 ENCOUNTER — Inpatient Hospital Stay (HOSPITAL_COMMUNITY): Payer: Managed Care, Other (non HMO) | Admitting: Certified Registered Nurse Anesthetist

## 2018-02-24 ENCOUNTER — Inpatient Hospital Stay (HOSPITAL_COMMUNITY)
Admission: RE | Admit: 2018-02-24 | Discharge: 2018-02-26 | DRG: 470 | Disposition: A | Discharge status: Home / Self Care | Payer: Managed Care, Other (non HMO) | Source: Ambulatory Visit | Attending: Orthopedic Surgery | Admitting: Orthopedic Surgery

## 2018-02-24 ENCOUNTER — Encounter (HOSPITAL_COMMUNITY)
Admission: RE | Disposition: A | Discharge status: Home / Self Care | Payer: Self-pay | Source: Ambulatory Visit | Attending: Orthopedic Surgery

## 2018-02-24 ENCOUNTER — Encounter (HOSPITAL_COMMUNITY): Payer: Self-pay | Admitting: *Deleted

## 2018-02-24 DIAGNOSIS — M1712 Unilateral primary osteoarthritis, left knee: Secondary | ICD-10-CM

## 2018-02-24 DIAGNOSIS — E119 Type 2 diabetes mellitus without complications: Secondary | ICD-10-CM | POA: Diagnosis present

## 2018-02-24 DIAGNOSIS — F419 Anxiety disorder, unspecified: Secondary | ICD-10-CM | POA: Diagnosis present

## 2018-02-24 DIAGNOSIS — Z79899 Other long term (current) drug therapy: Secondary | ICD-10-CM

## 2018-02-24 DIAGNOSIS — Z90710 Acquired absence of both cervix and uterus: Secondary | ICD-10-CM

## 2018-02-24 DIAGNOSIS — K219 Gastro-esophageal reflux disease without esophagitis: Secondary | ICD-10-CM | POA: Diagnosis present

## 2018-02-24 DIAGNOSIS — Z7982 Long term (current) use of aspirin: Secondary | ICD-10-CM

## 2018-02-24 DIAGNOSIS — Z791 Long term (current) use of non-steroidal anti-inflammatories (NSAID): Secondary | ICD-10-CM | POA: Diagnosis not present

## 2018-02-24 DIAGNOSIS — Z7984 Long term (current) use of oral hypoglycemic drugs: Secondary | ICD-10-CM

## 2018-02-24 DIAGNOSIS — M1710 Unilateral primary osteoarthritis, unspecified knee: Secondary | ICD-10-CM | POA: Diagnosis present

## 2018-02-24 DIAGNOSIS — I10 Essential (primary) hypertension: Secondary | ICD-10-CM | POA: Diagnosis present

## 2018-02-24 DIAGNOSIS — Z888 Allergy status to other drugs, medicaments and biological substances status: Secondary | ICD-10-CM

## 2018-02-24 DIAGNOSIS — Z881 Allergy status to other antibiotic agents status: Secondary | ICD-10-CM | POA: Diagnosis not present

## 2018-02-24 DIAGNOSIS — M659 Synovitis and tenosynovitis, unspecified: Secondary | ICD-10-CM | POA: Diagnosis present

## 2018-02-24 HISTORY — PX: TOTAL KNEE ARTHROPLASTY: SHX125

## 2018-02-24 LAB — GLUCOSE, CAPILLARY
GLUCOSE-CAPILLARY: 102 mg/dL — AB (ref 65–99)
Glucose-Capillary: 118 mg/dL — ABNORMAL HIGH (ref 65–99)
Glucose-Capillary: 112 mg/dL — ABNORMAL HIGH (ref 65–99)

## 2018-02-24 SURGERY — ARTHROPLASTY, KNEE, TOTAL
Anesthesia: Spinal | Site: Knee | Laterality: Left

## 2018-02-24 MED ORDER — CLONIDINE HCL (ANALGESIA) 100 MCG/ML EP SOLN
EPIDURAL | Status: DC | PRN
  Administered 2018-02-24: .5 mL

## 2018-02-24 MED ORDER — PROPOFOL 10 MG/ML IV BOLUS
INTRAVENOUS | Status: AC
  Filled 2018-02-24: qty 20

## 2018-02-24 MED ORDER — TRANEXAMIC ACID 1000 MG/10ML IV SOLN
1000.0000 mg | INTRAVENOUS | Status: AC
  Administered 2018-02-24: 1000 mg via INTRAVENOUS
  Filled 2018-02-24: qty 1100

## 2018-02-24 MED ORDER — OXYCODONE HCL 5 MG PO TABS
5.0000 mg | ORAL_TABLET | ORAL | Status: DC | PRN
  Administered 2018-02-24 – 2018-02-26 (×9): 10 mg via ORAL
  Filled 2018-02-24 (×9): qty 2

## 2018-02-24 MED ORDER — DOCUSATE SODIUM 100 MG PO CAPS
100.0000 mg | ORAL_CAPSULE | Freq: Two times a day (BID) | ORAL | Status: DC
  Administered 2018-02-24 – 2018-02-26 (×4): 100 mg via ORAL
  Filled 2018-02-24 (×4): qty 1

## 2018-02-24 MED ORDER — MIDAZOLAM HCL 2 MG/2ML IJ SOLN
INTRAMUSCULAR | Status: AC
  Filled 2018-02-24: qty 2

## 2018-02-24 MED ORDER — BUPIVACAINE LIPOSOME 1.3 % IJ SUSP
20.0000 mL | INTRAMUSCULAR | Status: DC
  Filled 2018-02-24: qty 20

## 2018-02-24 MED ORDER — METOCLOPRAMIDE HCL 5 MG PO TABS: 5.0000 mg | ORAL_TABLET | Freq: Three times a day (TID) | ORAL | Status: DC | PRN

## 2018-02-24 MED ORDER — BUPIVACAINE HCL 0.5 % IJ SOLN
INTRAMUSCULAR | Status: DC | PRN
  Administered 2018-02-24: 15 mL

## 2018-02-24 MED ORDER — MIDAZOLAM HCL 2 MG/2ML IJ SOLN
INTRAMUSCULAR | Status: AC
  Administered 2018-02-24: 2 mg
  Filled 2018-02-24: qty 2

## 2018-02-24 MED ORDER — LACTATED RINGERS IV SOLN: INTRAVENOUS | Status: DC

## 2018-02-24 MED ORDER — METFORMIN HCL 500 MG PO TABS
250.0000 mg | ORAL_TABLET | Freq: Every day | ORAL | Status: DC
  Administered 2018-02-25: 250 mg via ORAL
  Filled 2018-02-24: qty 1

## 2018-02-24 MED ORDER — PROPOFOL 500 MG/50ML IV EMUL
INTRAVENOUS | Status: DC | PRN
  Administered 2018-02-24: 75 ug/kg/min via INTRAVENOUS

## 2018-02-24 MED ORDER — HYDROMORPHONE HCL 1 MG/ML IJ SOLN
0.5000 mg | INTRAMUSCULAR | Status: DC | PRN
  Administered 2018-02-24 – 2018-02-25 (×5): 1 mg via INTRAVENOUS
  Filled 2018-02-24 (×5): qty 1

## 2018-02-24 MED ORDER — CYCLOBENZAPRINE HCL 10 MG PO TABS
10.0000 mg | ORAL_TABLET | Freq: Three times a day (TID) | ORAL | Status: DC | PRN
  Administered 2018-02-24 – 2018-02-26 (×4): 10 mg via ORAL
  Filled 2018-02-24 (×4): qty 1

## 2018-02-24 MED ORDER — ROPIVACAINE HCL 7.5 MG/ML IJ SOLN
INTRAMUSCULAR | Status: DC | PRN
  Administered 2018-02-24: 20 mL via PERINEURAL

## 2018-02-24 MED ORDER — ONDANSETRON HCL 4 MG PO TABS: 4.0000 mg | ORAL_TABLET | Freq: Four times a day (QID) | ORAL | Status: DC | PRN

## 2018-02-24 MED ORDER — CLINDAMYCIN PHOSPHATE 900 MG/50ML IV SOLN
900.0000 mg | INTRAVENOUS | Status: AC
  Administered 2018-02-24: 900 mg via INTRAVENOUS
  Filled 2018-02-24: qty 50

## 2018-02-24 MED ORDER — BUPIVACAINE HCL (PF) 0.5 % IJ SOLN
INTRAMUSCULAR | Status: AC
  Filled 2018-02-24: qty 30

## 2018-02-24 MED ORDER — ALPRAZOLAM 0.5 MG PO TABS
0.5000 mg | ORAL_TABLET | Freq: Every day | ORAL | Status: DC
  Administered 2018-02-25 – 2018-02-26 (×2): 0.5 mg via ORAL
  Filled 2018-02-24 (×2): qty 1

## 2018-02-24 MED ORDER — PHENOL 1.4 % MT LIQD: 1.0000 | OROMUCOSAL | Status: DC | PRN

## 2018-02-24 MED ORDER — PROPOFOL 10 MG/ML IV BOLUS
INTRAVENOUS | Status: DC | PRN
  Administered 2018-02-24: 30 mg via INTRAVENOUS

## 2018-02-24 MED ORDER — CHLORHEXIDINE GLUCONATE 4 % EX LIQD: 60.0000 mL | Freq: Once | CUTANEOUS | Status: DC

## 2018-02-24 MED ORDER — MORPHINE SULFATE (PF) 4 MG/ML IV SOLN
INTRAVENOUS | Status: AC
  Filled 2018-02-24: qty 2

## 2018-02-24 MED ORDER — FENTANYL CITRATE (PF) 100 MCG/2ML IJ SOLN: 100.0000 ug | Freq: Once | INTRAMUSCULAR | Status: DC

## 2018-02-24 MED ORDER — AMLODIPINE BESYLATE 10 MG PO TABS
10.0000 mg | ORAL_TABLET | Freq: Every day | ORAL | Status: DC
  Administered 2018-02-25 – 2018-02-26 (×2): 10 mg via ORAL
  Filled 2018-02-24 (×2): qty 1

## 2018-02-24 MED ORDER — FENTANYL CITRATE (PF) 100 MCG/2ML IJ SOLN: 25.0000 ug | INTRAMUSCULAR | Status: DC | PRN

## 2018-02-24 MED ORDER — ONDANSETRON HCL 4 MG/2ML IJ SOLN
INTRAMUSCULAR | Status: AC
  Filled 2018-02-24: qty 6

## 2018-02-24 MED ORDER — VITAMIN D 1000 UNITS PO TABS
2000.0000 [IU] | ORAL_TABLET | Freq: Every day | ORAL | Status: DC
  Administered 2018-02-25 – 2018-02-26 (×2): 2000 [IU] via ORAL
  Filled 2018-02-24 (×3): qty 2

## 2018-02-24 MED ORDER — MIDAZOLAM HCL 2 MG/2ML IJ SOLN
INTRAMUSCULAR | Status: DC | PRN
  Administered 2018-02-24: 2 mg via INTRAVENOUS

## 2018-02-24 MED ORDER — MEPERIDINE HCL 50 MG/ML IJ SOLN: 6.2500 mg | INTRAMUSCULAR | Status: DC | PRN

## 2018-02-24 MED ORDER — ACETAMINOPHEN 325 MG PO TABS
325.0000 mg | ORAL_TABLET | Freq: Four times a day (QID) | ORAL | Status: DC | PRN
  Administered 2018-02-25 – 2018-02-26 (×3): 650 mg via ORAL
  Filled 2018-02-24 (×3): qty 2

## 2018-02-24 MED ORDER — FENTANYL CITRATE (PF) 100 MCG/2ML IJ SOLN
INTRAMUSCULAR | Status: AC
  Administered 2018-02-24: 100 ug
  Filled 2018-02-24: qty 2

## 2018-02-24 MED ORDER — FLUOXETINE HCL 20 MG PO CAPS
20.0000 mg | ORAL_CAPSULE | Freq: Every day | ORAL | Status: DC
  Administered 2018-02-24 – 2018-02-25 (×2): 20 mg via ORAL
  Filled 2018-02-24 (×2): qty 1

## 2018-02-24 MED ORDER — MIDAZOLAM HCL 2 MG/2ML IJ SOLN: 2.0000 mg | Freq: Once | INTRAMUSCULAR | Status: DC

## 2018-02-24 MED ORDER — BUPIVACAINE LIPOSOME 1.3 % IJ SUSP
20.0000 mL | INTRAMUSCULAR | Status: AC
  Administered 2018-02-24: 20 mL
  Filled 2018-02-24: qty 20

## 2018-02-24 MED ORDER — PHENYLEPHRINE 40 MCG/ML (10ML) SYRINGE FOR IV PUSH (FOR BLOOD PRESSURE SUPPORT)
PREFILLED_SYRINGE | INTRAVENOUS | Status: AC
  Filled 2018-02-24: qty 20

## 2018-02-24 MED ORDER — BUPIVACAINE-EPINEPHRINE 0.25% -1:200000 IJ SOLN
INTRAMUSCULAR | Status: DC | PRN
  Administered 2018-02-24: 20 mL

## 2018-02-24 MED ORDER — RIVAROXABAN 10 MG PO TABS
10.0000 mg | ORAL_TABLET | Freq: Every day | ORAL | Status: DC
  Administered 2018-02-25 – 2018-02-26 (×2): 10 mg via ORAL
  Filled 2018-02-24 (×2): qty 1

## 2018-02-24 MED ORDER — LIDOCAINE HCL (CARDIAC) 20 MG/ML IV SOLN
INTRAVENOUS | Status: AC
  Filled 2018-02-24: qty 15

## 2018-02-24 MED ORDER — METOCLOPRAMIDE HCL 5 MG/ML IJ SOLN: 10.0000 mg | Freq: Once | INTRAMUSCULAR | Status: DC | PRN

## 2018-02-24 MED ORDER — MENTHOL 3 MG MT LOZG: 1.0000 | LOZENGE | OROMUCOSAL | Status: DC | PRN

## 2018-02-24 MED ORDER — LIDOCAINE HCL (CARDIAC) 20 MG/ML IV SOLN
INTRAVENOUS | Status: DC | PRN
  Administered 2018-02-24: 80 mg via INTRAVENOUS

## 2018-02-24 MED ORDER — BUPIVACAINE IN DEXTROSE 0.75-8.25 % IT SOLN
INTRATHECAL | Status: DC | PRN
  Administered 2018-02-24: 13.5 mg via INTRATHECAL

## 2018-02-24 MED ORDER — METFORMIN HCL 500 MG PO TABS
500.0000 mg | ORAL_TABLET | Freq: Every day | ORAL | Status: DC
  Administered 2018-02-25 – 2018-02-26 (×2): 500 mg via ORAL
  Filled 2018-02-24 (×2): qty 1

## 2018-02-24 MED ORDER — METOCLOPRAMIDE HCL 5 MG/ML IJ SOLN: 5.0000 mg | Freq: Three times a day (TID) | INTRAMUSCULAR | Status: DC | PRN

## 2018-02-24 MED ORDER — MORPHINE SULFATE (PF) 4 MG/ML IV SOLN
INTRAVENOUS | Status: DC | PRN
  Administered 2018-02-24: 8 mg

## 2018-02-24 MED ORDER — 0.9 % SODIUM CHLORIDE (POUR BTL) OPTIME
TOPICAL | Status: DC | PRN
  Administered 2018-02-24 (×3): 1000 mL

## 2018-02-24 MED ORDER — LOSARTAN POTASSIUM 50 MG PO TABS
50.0000 mg | ORAL_TABLET | Freq: Every day | ORAL | Status: DC
  Administered 2018-02-24 – 2018-02-26 (×3): 50 mg via ORAL
  Filled 2018-02-24 (×3): qty 1

## 2018-02-24 MED ORDER — BUPIVACAINE-EPINEPHRINE (PF) 0.25% -1:200000 IJ SOLN
INTRAMUSCULAR | Status: AC
  Filled 2018-02-24: qty 30

## 2018-02-24 MED ORDER — PANTOPRAZOLE SODIUM 40 MG PO TBEC
40.0000 mg | DELAYED_RELEASE_TABLET | Freq: Every day | ORAL | Status: DC
  Administered 2018-02-25 – 2018-02-26 (×2): 40 mg via ORAL
  Filled 2018-02-24 (×2): qty 1

## 2018-02-24 MED ORDER — METOPROLOL SUCCINATE ER 25 MG PO TB24
25.0000 mg | ORAL_TABLET | Freq: Every day | ORAL | Status: DC
  Administered 2018-02-25 – 2018-02-26 (×2): 25 mg via ORAL
  Filled 2018-02-24 (×2): qty 1

## 2018-02-24 MED ORDER — VITAMIN B-12 1000 MCG PO TABS
1500.0000 ug | ORAL_TABLET | Freq: Every day | ORAL | Status: DC
  Administered 2018-02-25 – 2018-02-26 (×2): 1500 ug via ORAL
  Filled 2018-02-24: qty 2
  Filled 2018-02-24: qty 3
  Filled 2018-02-24: qty 2

## 2018-02-24 MED ORDER — TRANEXAMIC ACID 1000 MG/10ML IV SOLN
2000.0000 mg | INTRAVENOUS | Status: AC
  Administered 2018-02-24: 2000 mg via TOPICAL
  Filled 2018-02-24: qty 20

## 2018-02-24 MED ORDER — FENTANYL CITRATE (PF) 250 MCG/5ML IJ SOLN
INTRAMUSCULAR | Status: AC
  Filled 2018-02-24: qty 5

## 2018-02-24 MED ORDER — SODIUM CHLORIDE 0.9 % IR SOLN
Status: DC | PRN
  Administered 2018-02-24: 3000 mL

## 2018-02-24 MED ORDER — ROCURONIUM BROMIDE 10 MG/ML (PF) SYRINGE
PREFILLED_SYRINGE | INTRAVENOUS | Status: AC
  Filled 2018-02-24: qty 5

## 2018-02-24 MED ORDER — VANCOMYCIN HCL IN DEXTROSE 1-5 GM/200ML-% IV SOLN
1000.0000 mg | Freq: Two times a day (BID) | INTRAVENOUS | Status: AC
  Administered 2018-02-24: 1000 mg via INTRAVENOUS
  Filled 2018-02-24: qty 200

## 2018-02-24 MED ORDER — LACTATED RINGERS IV SOLN
INTRAVENOUS | Status: DC
  Administered 2018-02-24 (×2): via INTRAVENOUS

## 2018-02-24 MED ORDER — GABAPENTIN 300 MG PO CAPS
300.0000 mg | ORAL_CAPSULE | Freq: Three times a day (TID) | ORAL | Status: DC
  Administered 2018-02-24 – 2018-02-26 (×5): 300 mg via ORAL
  Filled 2018-02-24 (×5): qty 1

## 2018-02-24 MED ORDER — POTASSIUM CHLORIDE IN NACL 20-0.9 MEQ/L-% IV SOLN
INTRAVENOUS | Status: AC
  Administered 2018-02-24: 19:00:00 via INTRAVENOUS
  Filled 2018-02-24: qty 1000

## 2018-02-24 MED ORDER — SODIUM CHLORIDE 0.9% FLUSH
INTRAVENOUS | Status: DC | PRN
  Administered 2018-02-24: 20 mL via INTRAVENOUS

## 2018-02-24 MED ORDER — ONDANSETRON HCL 4 MG/2ML IJ SOLN: 4.0000 mg | Freq: Four times a day (QID) | INTRAMUSCULAR | Status: DC | PRN

## 2018-02-24 SURGICAL SUPPLY — 79 items
BAG DECANTER FOR FLEXI CONT (MISCELLANEOUS) ×3 IMPLANT
BANDAGE ESMARK 6X9 LF (GAUZE/BANDAGES/DRESSINGS) ×1 IMPLANT
BLADE SAG 18X100X1.27 (BLADE) ×3 IMPLANT
BLADE SAW SGTL 13.0X1.19X90.0M (BLADE) IMPLANT
BNDG CMPR 9X6 STRL LF SNTH (GAUZE/BANDAGES/DRESSINGS) ×1
BNDG CMPR MED 15X6 ELC VLCR LF (GAUZE/BANDAGES/DRESSINGS) ×1
BNDG COHESIVE 6X5 TAN STRL LF (GAUZE/BANDAGES/DRESSINGS) ×3 IMPLANT
BNDG ELASTIC 6X15 VLCR STRL LF (GAUZE/BANDAGES/DRESSINGS) ×3 IMPLANT
BNDG ESMARK 6X9 LF (GAUZE/BANDAGES/DRESSINGS) ×3
BOWL SMART MIX CTS (DISPOSABLE) IMPLANT
BSPLAT TIB 3 KN TRITANIUM (Knees) ×1 IMPLANT
CLOSURE WOUND 1/2 X4 (GAUZE/BANDAGES/DRESSINGS) ×1
COMP FEMORAL TRIATHLON SZ3 (Joint) ×3 IMPLANT
COMPONENT FEMRL TRIATHLON SZ3 (Joint) IMPLANT
CONT SPECI 4OZ STER CLIK (MISCELLANEOUS) ×3 IMPLANT
COVER SURGICAL LIGHT HANDLE (MISCELLANEOUS) ×3 IMPLANT
CUFF TOURNIQUET SINGLE 34IN LL (TOURNIQUET CUFF) ×3 IMPLANT
CUFF TOURNIQUET SINGLE 44IN (TOURNIQUET CUFF) IMPLANT
DECANTER SPIKE VIAL GLASS SM (MISCELLANEOUS) ×3 IMPLANT
DRAPE INCISE IOBAN 66X45 STRL (DRAPES) ×2 IMPLANT
DRAPE ORTHO SPLIT 77X108 STRL (DRAPES) ×9
DRAPE SURG ORHT 6 SPLT 77X108 (DRAPES) ×3 IMPLANT
DRAPE U-SHAPE 47X51 STRL (DRAPES) ×3 IMPLANT
DRSG AQUACEL AG ADV 3.5X10 (GAUZE/BANDAGES/DRESSINGS) ×2 IMPLANT
DRSG AQUACEL AG ADV 3.5X14 (GAUZE/BANDAGES/DRESSINGS) IMPLANT
DURAPREP 26ML APPLICATOR (WOUND CARE) ×6 IMPLANT
ELECT CAUTERY BLADE 6.4 (BLADE) ×3 IMPLANT
ELECT REM PT RETURN 9FT ADLT (ELECTROSURGICAL) ×3
ELECTRODE REM PT RTRN 9FT ADLT (ELECTROSURGICAL) ×1 IMPLANT
GAUZE SPONGE 4X4 12PLY STRL (GAUZE/BANDAGES/DRESSINGS) ×3 IMPLANT
GLOVE BIOGEL PI IND STRL 7.5 (GLOVE) ×1 IMPLANT
GLOVE BIOGEL PI IND STRL 8 (GLOVE) ×1 IMPLANT
GLOVE BIOGEL PI INDICATOR 7.5 (GLOVE) ×2
GLOVE BIOGEL PI INDICATOR 8 (GLOVE) ×2
GLOVE ECLIPSE 7.0 STRL STRAW (GLOVE) ×3 IMPLANT
GLOVE SURG ORTHO 8.0 STRL STRW (GLOVE) ×3 IMPLANT
GOWN STRL REUS W/ TWL LRG LVL3 (GOWN DISPOSABLE) ×3 IMPLANT
GOWN STRL REUS W/TWL LRG LVL3 (GOWN DISPOSABLE) ×9
HANDPIECE INTERPULSE COAX TIP (DISPOSABLE) ×3
HOOD PEEL AWAY FLYTE STAYCOOL (MISCELLANEOUS) ×9 IMPLANT
IMMOBILIZER KNEE 20 (SOFTGOODS) IMPLANT
IMMOBILIZER KNEE 22 UNIV (SOFTGOODS) IMPLANT
IMMOBILIZER KNEE 24 THIGH 36 (MISCELLANEOUS) IMPLANT
IMMOBILIZER KNEE 24 UNIV (MISCELLANEOUS)
INSERT TIB 3 9XCNDRL STAB BRNG (Insert) IMPLANT
KIT BASIN OR (CUSTOM PROCEDURE TRAY) ×3 IMPLANT
KIT ROOM TURNOVER OR (KITS) ×3 IMPLANT
KNEE INSERT TIB BRG (Insert) ×3 IMPLANT
KNEE PATELLA ASYMMETRIC 9X29 (Knees) ×2 IMPLANT
KNEE TIBIAL COMPONENT SZ3 (Knees) ×2 IMPLANT
MANIFOLD NEPTUNE II (INSTRUMENTS) ×3 IMPLANT
NDL SAFETY ECLIPSE 18X1.5 (NEEDLE) ×1 IMPLANT
NEEDLE 22X1 1/2 (OR ONLY) (NEEDLE) ×6 IMPLANT
NEEDLE HYPO 18GX1.5 SHARP (NEEDLE) ×3
NS IRRIG 1000ML POUR BTL (IV SOLUTION) ×6 IMPLANT
PACK TOTAL JOINT (CUSTOM PROCEDURE TRAY) ×3 IMPLANT
PAD ARMBOARD 7.5X6 YLW CONV (MISCELLANEOUS) ×6 IMPLANT
PAD CAST 4YDX4 CTTN HI CHSV (CAST SUPPLIES) ×1 IMPLANT
PADDING CAST COTTON 4X4 STRL (CAST SUPPLIES) ×3
PADDING CAST COTTON 6X4 STRL (CAST SUPPLIES) ×3 IMPLANT
SET HNDPC FAN SPRY TIP SCT (DISPOSABLE) ×1 IMPLANT
STRIP CLOSURE SKIN 1/2X4 (GAUZE/BANDAGES/DRESSINGS) ×3 IMPLANT
SUCTION FRAZIER HANDLE 10FR (MISCELLANEOUS) ×2
SUCTION TUBE FRAZIER 10FR DISP (MISCELLANEOUS) ×1 IMPLANT
SUT 0 FIBERLOOP 38 BLUE TPR ND (SUTURE) ×15
SUT MNCRL AB 3-0 PS2 18 (SUTURE) ×3 IMPLANT
SUT VIC AB 0 CT1 27 (SUTURE) ×9
SUT VIC AB 0 CT1 27XBRD ANBCTR (SUTURE) ×3 IMPLANT
SUT VIC AB 1 CT1 27 (SUTURE) ×15
SUT VIC AB 1 CT1 27XBRD ANBCTR (SUTURE) ×5 IMPLANT
SUT VIC AB 2-0 CT1 27 (SUTURE) ×12
SUT VIC AB 2-0 CT1 TAPERPNT 27 (SUTURE) ×4 IMPLANT
SUTURE 0 FIBERLP 38 BLU TPR ND (SUTURE) IMPLANT
SYR 30ML LL (SYRINGE) ×9 IMPLANT
SYR TB 1ML LUER SLIP (SYRINGE) ×3 IMPLANT
TOWEL OR 17X24 6PK STRL BLUE (TOWEL DISPOSABLE) ×6 IMPLANT
TOWEL OR 17X26 10 PK STRL BLUE (TOWEL DISPOSABLE) ×6 IMPLANT
TRAY CATH 16FR W/PLASTIC CATH (SET/KITS/TRAYS/PACK) ×2 IMPLANT
WATER STERILE IRR 1000ML POUR (IV SOLUTION) IMPLANT

## 2018-02-24 NOTE — Anesthesia Procedure Notes (Signed)
Spinal  Patient location during procedure: OR Start time: 02/24/2018 1:44 PM End time: 02/24/2018 1:49 PM Staffing Anesthesiologist: Gaynelle AduFitzgerald, Taquisha Phung, MD Performed: anesthesiologist  Preanesthetic Checklist Completed: patient identified, surgical consent, pre-op evaluation, timeout performed, IV checked, risks and benefits discussed and monitors and equipment checked Spinal Block Patient position: sitting Prep: DuraPrep Patient monitoring: cardiac monitor, continuous pulse ox and blood pressure Approach: midline Location: L3-4 Injection technique: single-shot Needle Needle type: Pencan  Needle gauge: 24 G Needle length: 9 cm Assessment Sensory level: T8 Additional Notes Functioning IV was confirmed and monitors were applied. Sterile prep and drape, including hand hygiene and sterile gloves were used. The patient was positioned and the spine was prepped. The skin was anesthetized with lidocaine.  Free flow of clear CSF was obtained prior to injecting local anesthetic into the CSF.  The spinal needle aspirated freely following injection.  The needle was carefully withdrawn.  The patient tolerated the procedure well.

## 2018-02-24 NOTE — Anesthesia Postprocedure Evaluation (Signed)
Anesthesia Post Note  Patient: Raven Shaw  Procedure(s) Performed: LEFT TOTAL KNEE ARTHROPLASTY (Left Knee)     Patient location during evaluation: PACU Anesthesia Type: Spinal and Regional Level of consciousness: oriented and awake and alert Pain management: pain level controlled Vital Signs Assessment: post-procedure vital signs reviewed and stable Respiratory status: spontaneous breathing, respiratory function stable and patient connected to nasal cannula oxygen Cardiovascular status: blood pressure returned to baseline and stable Postop Assessment: no headache, no backache and no apparent nausea or vomiting Anesthetic complications: no    Last Vitals:  Vitals:   02/24/18 1700 02/24/18 1715  BP: 122/85 116/70  Pulse: 76 76  Resp: 16 20  Temp: 36.5 C   SpO2: 100% 97%    Last Pain:  Vitals:   02/24/18 1700  TempSrc:   PainSc: 0-No pain                 Cadyn Rodger,W. EDMOND

## 2018-02-24 NOTE — Progress Notes (Signed)
Orthopedic Tech Progress Note Patient Details:  Raven Shaw [birth year][name][birth year] [id number]  CPM Left Knee CPM Left Knee: On Left Knee Flexion (Degrees): 40 Left Knee Extension (Degrees): 10 Additional Comments: applied cpm to pt left knee.  pt tolerated application very well.  CPM set to 40-10 degrees. Left knee.  Bone foam provided at bedside.  Left Knee.   Post Interventions Patient Tolerated: Well Instructions Provided: Care of device  Alvina ChouWilliams, Raven Shaw C 02/24/2018, 5:46 PM

## 2018-02-24 NOTE — Progress Notes (Signed)
Orthopedic Tech Progress Note Patient Details:  Raven Shaw [birth year] [id number] Applied ohf to bed. Ortho Devices Type of Ortho Device: Bone foam zero knee Ortho Device/Splint Location: applied ohf to bed Ortho Device/Splint Interventions: Ordered, Application   Post Interventions Patient Tolerated: Well Instructions Provided: Care of device   [name], [name] 02/24/2018, 8:49 PM

## 2018-02-24 NOTE — Brief Op Note (Signed)
02/24/2018  5:09 PM  PATIENT:  Raven Shaw  57 y.o. female  PRE-OPERATIVE DIAGNOSIS:  Osteoarthritis Left Knee  POST-OPERATIVE DIAGNOSIS:  Osteoarthritis Left Knee  PROCEDURE:  Procedure(s): LEFT TOTAL KNEE ARTHROPLASTY  SURGEON:  Surgeon(s): Cammy Copaean, Elaine Roanhorse Scott, MD  ASSISTANT: Patrick Jupiterarla Bethune rnfa  ANESTHESIA:   spinal  EBL: 100 ml    Total I/O In: 1700 [I.V.:1700] Out: 800 [Urine:700; Blood:100]  BLOOD ADMINISTERED: none  DRAINS: none   LOCAL MEDICATIONS USED:  Marcaine mso4 clonidine exparel  SPECIMEN:  No Specimen  COUNTS:  YES  TOURNIQUET:   Total Tourniquet Time Documented: Thigh (Right) - 108 minutes Total: Thigh (Right) - 108 minutes   DICTATION: .Other Dictation: Dictation Number [id number]  PLAN OF CARE: Admit to inpatient   PATIENT DISPOSITION:  PACU - hemodynamically stable

## 2018-02-24 NOTE — Transfer of Care (Signed)
Immediate Anesthesia Transfer of Care Note  Patient: Raven Shaw  Procedure(s) Performed: LEFT TOTAL KNEE ARTHROPLASTY (Left Knee)  Patient Location: PACU  Anesthesia Type:MAC and Spinal  Level of Consciousness: awake and sedated  Airway & Oxygen Therapy: Patient Spontanous Breathing and Patient connected to face mask oxygen  Post-op Assessment: Report given to RN and Post -op Vital signs reviewed and stable  Post vital signs: Reviewed and stable  Last Vitals:  Vitals:   02/24/18 1240 02/24/18 1700  BP: (!) 148/92 122/85  Pulse: 91 76  Resp: 15 16  Temp:  36.5 C  SpO2: 100% 100%    Last Pain:  Vitals:   02/24/18 1700  TempSrc:   PainSc: 0-No pain      Patients Stated Pain Goal: 3 (23/76/28 3151)  Complications: No apparent anesthesia complications

## 2018-02-24 NOTE — Plan of Care (Signed)
  Activity: Risk for activity intolerance will decrease 02/24/2018 1831 - Progressing by Darrow BussingArcilla, Veniamin Kincaid M, RN   Nutrition: Adequate nutrition will be maintained 02/24/2018 1831 - Progressing by Darrow BussingArcilla, Quinnlyn Hearns M, RN   Elimination: Will not experience complications related to bowel motility 02/24/2018 1831 - Progressing by Darrow BussingArcilla, Victoriana Aziz M, RN   Pain Managment: General experience of comfort will improve 02/24/2018 1831 - Progressing by Darrow BussingArcilla, Evani Shrider M, RN   Safety: Ability to remain free from injury will improve 02/24/2018 1831 - Progressing by Darrow BussingArcilla, Dawnelle Warman M, RN

## 2018-02-24 NOTE — Interval H&P Note (Signed)
History and Physical Interval Note:  02/24/2018 1:00 PM  Raven Shaw  has presented today for surgery, with the diagnosis of Osteoarthritis Left Knee  The various methods of treatment have been discussed with the patient and family. After consideration of risks, benefits and other options for treatment, the patient has consented to  Procedure(s): LEFT TOTAL KNEE ARTHROPLASTY (Left) as a surgical intervention .  The patient's history has been reviewed, patient examined, no change in status, stable for surgery.  I have reviewed the patient's chart and labs.  Questions were answered to the patient's satisfaction.     Burnard BuntingG Scott Dean

## 2018-02-24 NOTE — Anesthesia Preprocedure Evaluation (Addendum)
Anesthesia Evaluation  Patient identified by MRN, date of birth, ID band Patient awake    Reviewed: Allergy & Precautions, NPO status , Patient's Chart, lab work & pertinent test results  Airway Mallampati: II  TM Distance: >3 FB Neck ROM: Full    Dental no notable dental hx. (+) Caps, Dental Advisory Given   Pulmonary    Pulmonary exam normal breath sounds clear to auscultation       Cardiovascular hypertension, Pt. on medications Normal cardiovascular exam Rhythm:Regular Rate:Normal     Neuro/Psych Anxiety  Neuromuscular disease    GI/Hepatic Neg liver ROS, GERD  Medicated and Controlled,  Endo/Other  diabetes, Well Controlled, Type 2, Oral Hypoglycemic Agents  Renal/GU negative Renal ROS  negative genitourinary   Musculoskeletal  (+) Arthritis , Osteoarthritis,  DJD left knee   Abdominal   Peds  Hematology negative hematology ROS (+)   Anesthesia Other Findings   Reproductive/Obstetrics                            Anesthesia Physical Anesthesia Plan  ASA: II  Anesthesia Plan: Spinal   Post-op Pain Management:  Regional for Post-op pain   Induction:   PONV Risk Score and Plan: Midazolam, Ondansetron, Propofol infusion and Treatment may vary due to age or medical condition  Airway Management Planned: Natural Airway and Simple Face Mask  Additional Equipment:   Intra-op Plan:   Post-operative Plan:   Informed Consent: I have reviewed the patients History and Physical, chart, labs and discussed the procedure including the risks, benefits and alternatives for the proposed anesthesia with the patient or authorized representative who has indicated his/her understanding and acceptance.   Dental advisory given  Plan Discussed with: CRNA, Surgeon and Anesthesiologist  Anesthesia Plan Comments:         Anesthesia Quick Evaluation

## 2018-02-24 NOTE — Anesthesia Procedure Notes (Addendum)
Anesthesia Regional Block: Adductor canal block   Pre-Anesthetic Checklist: ,, timeout performed, Correct Patient, Correct Site, Correct Laterality, Correct Procedure, Correct Position, site marked, Risks and benefits discussed,  Surgical consent,  Pre-op evaluation,  At surgeon's request and post-op pain management  Laterality: Left  Prep: chloraprep       Needles:  Injection technique: Single-shot  Needle Type: Echogenic Stimulator Needle     Needle Length: 10cm  Needle Gauge: 21   Needle insertion depth: 5 cm   Additional Needles:   Procedures:,,,, ultrasound used (permanent image in chart),,,,  Narrative:  Start time: 02/24/2018 12:20 PM End time: 02/24/2018 12:25 PM Injection made incrementally with aspirations every 5 mL.  Performed by: Personally  Anesthesiologist: Mal Amabile, MD  Additional Notes: Timeout performed. Patient sedated. Relevant anatomy ID'd using [place]. Incremental 2-68ml injection of LA with frequent aspiration. Patient tolerated procedure well.       Left Adductor Canal Block

## 2018-02-25 ENCOUNTER — Encounter (HOSPITAL_COMMUNITY): Payer: Self-pay | Admitting: General Practice

## 2018-02-25 NOTE — Op Note (Signed)
NAME:  Raven Shaw, Raven Shaw                      ACCOUNT NO.:  MEDICAL RECORD NO.:  [id number]  LOCATION:                                 FACILITY:  PHYSICIAN:  Burnard Bunting, M.D.    DATE OF BIRTH:  Sep 28, 1961  DATE OF PROCEDURE: DATE OF DISCHARGE:                              OPERATIVE REPORT   PREOPERATIVE DIAGNOSIS:  Left knee arthritis.  POSTOPERATIVE DIAGNOSIS:  Left knee arthritis.  PROCEDURE:  Left total knee replacement.  SURGEON:  Burnard Bunting, M.D.  ASSISTANT:  Patrick Jupiter, RNFA.  INDICATIONS:  Raven Shaw is a 57 year old patient with end-stage left knee arthritis.  She presents now for operative management after explanation of risks and benefits.  The patient had mucoid degeneration of the ACL confirmed at the time of surgery, which prevents her from receiving partial knee replacement.  She presents now for knee replacement after explanation of risks and benefits.  IMPLANT UTILIZED:  Stryker press-fit posterior cruciate retaining 3 femur, 3 tibia, 9-mm cruciate retaining polyethylene insert, 29-mm 3-peg press-fit patella.  PROCEDURE IN DETAIL:  The patient was brought to the operating room where spinal anesthetic was induced.  Preoperative antibiotics administered.  Time-out was called.  The left leg was prescrubbed with alcohol and Betadine and allowed to air dry, prepped with DuraPrep solution and draped in a sterile manner.  Collier Flowers was used to cover the operative field.  Leg was elevated and exsanguinated with the Esmarch wrap.  Tourniquet was inflated.  Total tourniquet time about an hour and 45 minutes.  At this time, anterior approach to knee was made.  Skin and subcutaneous tissue were sharply divided.  A median parapatellar approach was made and the patella was everted.  Significant synovitis was present within the suprapatellar pouch.  Synovectomy was performed. The patient also had severe arthritis in the lateral compartment with eburnated bone on the tibia and  femoral condyle.  The patellofemoral joint also involved, but the medial femoral condyle relatively spared. At this time, after synovectomy was performed, fat pad was partially excised.  Lateral patellofemoral ligament was released.  Minimal soft tissue dissection was performed on the medial side due to the patient's valgus wear pattern.  The cut was then made perpendicular to the mechanical axis using intramedullary alignment on the tibia.  This was an 8-mm cut off the lateral side.  In general, with this valgus knee, both sides were symmetric and no side had excessive lower wear.  A 10-mm cut was made off the distal femur.  This allowed a 9-mm spacer to fit nicely.  The patient did have a little bit of preop valgus alignment along with about a 5-degree flexion contracture.  The femur was sized to a size 3.  The anterior, posterior, and chamfer cuts were made with the posterior neurovascular structures and collaterals protected.  At this time, the tibia was keel punched and prepared for a press-fit prosthesis.  Trial femur, tibia and the trial patella was placed after cutting the patella down 22-12 mm.  With the trial button in place, the patient had good patellar tracking.  This was improved with a very small lateral release.  The patient had full extension and excellent flexion with no liftoff.  Trial components removed.  Thorough irrigation performed.  Exparel, Marcaine, morphine solution was injected into the soft tissues of the knee.  Tranexamic acid sponge was then left to stay in the knee for about 3 minutes.  This was removed and then the true components were placed with same stability parameters maintained. Tourniquet was released at this time.  Bleeding points were encountered and controlled using electrocautery.  Thorough irrigation was performed. The patient had a little bit of partial anterior MCL stretch injury, which was repaired with 0 FiberWire suture.  The patient had  excellent stability to varus and valgus stress at both 0 and 30 degrees. Arthrotomy was closed using #1 Vicryl suture followed by interrupted inverted 0 Vicryl suture, 2-0 Vicryl suture, and 3-0 Monocryl.  Aquacel dressing placed.  The patient tolerated the procedure well without immediate complications.  Ace wrap and knee immobilizer placed.  The patient was transferred to the recovery room in stable condition.     Burnard BuntingG. Scott Yohana Bartha, M.D.     GSD/MEDQ  D:  02/24/2018  T:  02/24/2018  Job:  [id number]

## 2018-02-25 NOTE — Progress Notes (Signed)
Subjective: Pt stable - pain mod well controlled   Objective: Vital signs in last 24 hours: Temp:  [97.2 F (36.2 C)-98.8 F (37.1 C)] 98.8 F (37.1 C) (03/20 0429) Pulse Rate:  [73-109] 109 (03/20 0429) Resp:  [9-20] 16 (03/20 0007) BP: (103-160)/(70-94) 131/88 (03/20 0429) SpO2:  [94 %-100 %] 94 % (03/20 0429)  Intake/Output from previous day: 03/19 0701 - 03/20 0700 In: 1990 [P.O.:240; I.V.:1700] Out: 800 [Urine:700; Blood:100] Intake/Output this shift: No intake/output data recorded.  Exam:  Dorsiflexion/Plantar flexion intact  Labs: No results for input(s): HGB in the last 72 hours. No results for input(s): WBC, RBC, HCT, PLT in the last 72 hours. No results for input(s): NA, K, CL, CO2, BUN, CREATININE, GLUCOSE, CALCIUM in the last 72 hours. No results for input(s): LABPT, INR in the last 72 hours.  Assessment/Plan: Plan pt and cpm today - pt has been oob to bathroom - possible dc am   G Dorene GrebeScott Khalis Hittle 02/25/2018, 7:39 AM

## 2018-02-25 NOTE — Discharge Instructions (Signed)

## 2018-02-25 NOTE — Progress Notes (Signed)
Physical Therapy Treatment Patient Details Name: Raven Shaw MRN: [id number] DOB: 1961/04/16 Today's Date: 02/25/2018    History of Present Illness Admitted for LTKA, WBAT, KI ordered;  has a past medical history of Anxiety, Arthritis, Carpal tunnel syndrome, Diabetes mellitus without complication (HCC), GERD (gastroesophageal reflux disease), and Hypertension.     PT Comments    Pt sleeping on arrival, on rousing, pt agreeable to work with therapy. Pt performed supine total knee HEP before ambulating 60 ft with RW and min guard. Pt with significantly decrease gait speed and antalgic gait. No LOB noted. Pt's husband present for session, and reports pt's sister will be available to assist pt in the mornings when the husband is at work. Updated equipment recommendations to include BSC. Pt placed on CPM after session to encourage knee flexion. Will follow up tomorrow to progress gait training and HEP.     Follow Up Recommendations  Follow surgeon's recommendation for DC plan and follow-up therapies     Equipment Recommendations  Rolling walker with 5" wheels;3in1 (PT)    Recommendations for Other Services OT consult(for ADLs ordered per protocol)     Precautions / Restrictions Precautions Precautions: Knee Precaution Booklet Issued: Yes (comment) Precaution Comments: Provided total knee HEP Restrictions Weight Bearing Restrictions: Yes LLE Weight Bearing: Weight bearing as tolerated    Mobility  Bed Mobility Overal bed mobility: Needs Assistance Bed Mobility: Supine to Sit;Sit to Supine     Supine to sit: Min assist Sit to supine: Min guard   General bed mobility comments: Min A for LE managment to come to EOB. Min G to return to supine with R LE lifting L.  Transfers Overall transfer level: Needs assistance Equipment used: Rolling walker (2 wheeled) Transfers: Sit to/from Stand Sit to Stand: Min guard         General transfer comment: Cues for hand placement and  safety; overall good rise  Ambulation/Gait Ambulation/Gait assistance: Min guard Ambulation Distance (Feet): 60 Feet Assistive device: Rolling walker (2 wheeled) Gait Pattern/deviations: Step-to pattern;Decreased stride length;Decreased step length - right;Decreased stance time - left;Antalgic Gait velocity: slowed Gait velocity interpretation: Below normal speed for age/gender General Gait Details: Pt with significantly decreased gait speed. Antalgic but overall steady. Cues for postural control, sequencing and RW proximity.    Stairs            Wheelchair Mobility    Modified Rankin (Stroke Patients Only)       Balance Overall balance assessment: Needs assistance Sitting-balance support: Feet supported;No upper extremity supported Sitting balance-Leahy Scale: Good     Standing balance support: Bilateral upper extremity supported;No upper extremity supported Standing balance-Leahy Scale: Fair Standing balance comment: Able to stand at sink for hand hygeine w/o UE support.                            Cognition Arousal/Alertness: Awake/alert(Lethargic to start but came more awake with mobility) Behavior During Therapy: WFL for tasks assessed/performed Overall Cognitive Status: Within Functional Limits for tasks assessed                                        Exercises Total Joint Exercises Ankle Circles/Pumps: AROM;Both;10 reps;Supine Quad Sets: AROM;Left;5 reps;Supine Towel Squeeze: AROM;Left;5 reps;Supine Heel Slides: AAROM;Left;5 reps;Supine    General Comments General comments (skin integrity, edema, etc.): after treatment, pt assisted to  restroom. She is independent for peri care.      Pertinent Vitals/Pain Pain Assessment: Faces Faces Pain Scale: Hurts even more Pain Location: L knee with flexion Pain Descriptors / Indicators: Aching;Crying Pain Intervention(s): Monitored during session;Limited activity within patient's  tolerance;Repositioned    Home Living                      Prior Function            PT Goals (current goals can now be found in the care plan section) Acute Rehab PT Goals Patient Stated Goal: back on her motorcycle PT Goal Formulation: With patient Time For Goal Achievement: 03/04/18 Potential to Achieve Goals: Good Progress towards PT goals: Progressing toward goals    Frequency    7X/week      PT Plan Equipment recommendations need to be updated    Co-evaluation              AM-PAC PT "6 Clicks" Daily Activity  Outcome Measure  Difficulty turning over in bed (including adjusting bedclothes, sheets and blankets)?: A Little Difficulty moving from lying on back to sitting on the side of the bed? : A Little Difficulty sitting down on and standing up from a chair with arms (e.g., wheelchair, bedside commode, etc,.)?: Unable Help needed moving to and from a bed to chair (including a wheelchair)?: A Little Help needed walking in hospital room?: A Little Help needed climbing 3-5 steps with a railing? : A Lot 6 Click Score: 15    End of Session Equipment Utilized During Treatment: Gait belt Activity Tolerance: Patient tolerated treatment well Patient left: with call bell/phone within reach;with family/visitor present;in bed Nurse Communication: Mobility status PT Visit Diagnosis: Other abnormalities of gait and mobility (R26.89);Pain Pain - Right/Left: Left Pain - part of body: Knee     Time: [id number] PT Time Calculation (min) (ACUTE ONLY): 54 min  Charges:  $Gait Training: 23-37 mins $Therapeutic Exercise: 8-22 mins $Therapeutic Activity: 8-22 mins                    G Codes:      Kallie LocksHannah Arnisha Laffoon, [place]PTA Pager [phone] Acute Rehab   Sheral ApleyHannah E Durinda Buzzelli 02/25/2018, 4:16 PM

## 2018-02-25 NOTE — Evaluation (Signed)
Physical Therapy Evaluation Patient Details Name: Raven Shaw MRN: [id number] DOB: September 01, 1961 Today's Date: 02/25/2018   History of Present Illness  Admitted for LTKA, WBAT, KI ordered;  has a past medical history of Anxiety, Arthritis, Carpal tunnel syndrome, Diabetes mellitus without complication (HCC), GERD (gastroesophageal reflux disease), and Hypertension.   Clinical Impression   Pt is s/p TKA resulting in the deficits listed below (see PT Problem List). Presents with pain L knee limiting motion (especially into flexion); Groggy with pain medication this session, but overall moving well (and in good spirits) once up and walking; Her husband works days (early shift), so she must be completely modified independent to manage safely at home; Ordered OT per protocol to address independence with ADLs;  Pt will benefit from skilled PT to increase their independence and safety with mobility to allow discharge to the venue listed below.      Follow Up Recommendations Follow surgeon's recommendation for DC plan and follow-up therapies    Equipment Recommendations  Rolling walker with 5" wheels;Other (comment)(will defer 3in1 rec to OT)    Recommendations for Other Services OT consult(for ADLs ordered per protocol)     Precautions / Restrictions Precautions Precautions: Knee Precaution Booklet Issued: Yes (comment) Precaution Comments: Pt educated to not allow any pillow or bolster under knee for healing with optimal range of motion.  Restrictions Weight Bearing Restrictions: Yes LLE Weight Bearing: Weight bearing as tolerated      Mobility  Bed Mobility Overal bed mobility: Needs Assistance Bed Mobility: Supine to Sit     Supine to sit: Min assist     General bed mobility comments: Min assist to support LLE coming off of bed  Transfers Overall transfer level: Needs assistance Equipment used: Rolling walker (2 wheeled) Transfers: Sit to/from Stand Sit to Stand: Min guard          General transfer comment: Cues for hand placement and safety; overall good rise  Ambulation/Gait Ambulation/Gait assistance: Min guard Ambulation Distance (Feet): 80 Feet Assistive device: Rolling walker (2 wheeled) Gait Pattern/deviations: Step-through pattern Gait velocity: slowed   General Gait Details: Cues to activate L quad for stance stability, and to even out gait; hips slightly flexed throughout gait cycle, but she was able to correct with cues  Stairs            Wheelchair Mobility    Modified Rankin (Stroke Patients Only)       Balance                                             Pertinent Vitals/Pain Pain Assessment: 0-10 Pain Score: 10-Worst pain ever Pain Location: L knee with flexion Pain Descriptors / Indicators: Aching;Crying Pain Intervention(s): Monitored during session;Premedicated before session    Home Living Family/patient expects to be discharged to:: Private residence Living Arrangements: Spouse/significant other Available Help at Discharge: Family;Available PRN/intermittently Type of Home: House Home Access: Ramped entrance     Home Layout: Two level("she-cave" is 3 steps down) Home Equipment: Shower seat(House already has some modifications for dtr, who uses wheelchair)      Prior Function Level of Independence: Independent               Hand Dominance        Extremity/Trunk Assessment   Upper Extremity Assessment Upper Extremity Assessment: Defer to OT evaluation    Lower Extremity  Assessment Lower Extremity Assessment: LLE deficits/detail LLE Deficits / Details: Grossly decr AROM and strength, limited by pain postop LLE: Unable to fully assess due to pain       Communication      Cognition Arousal/Alertness: Awake/alert;Suspect due to medications(Slightly sleepy) Behavior During Therapy[facility] for tasks assessed/performed Overall Cognitive Status: Within Functional Limits for tasks  assessed                                        General Comments General comments (skin integrity, edema, etc.): Slow moving, but overall good activity tolerance    Exercises     Assessment/Plan    PT Assessment Patient needs continued PT services  PT Problem List Decreased strength;Decreased range of motion;Decreased activity tolerance;Decreased balance;Decreased mobility;Decreased knowledge of use of DME;Decreased knowledge of precautions;Pain       PT Treatment Interventions DME instruction;Gait training;Stair training;Functional mobility training;Therapeutic activities;Therapeutic exercise;Patient/family education    PT Goals (Current goals can be found in the Care Plan section)  Acute Rehab PT Goals Patient Stated Goal: back on her motorcycle PT Goal Formulation: With patient Time For Goal Achievement: 03/04/18 Potential to Achieve Goals: Good    Frequency 7X/week   Barriers to discharge Decreased caregiver support Will be at home alone from about 5 am (she typically gets up at 9am) to 2 pm; must be modified independent to manage at home    Co-evaluation               AM-PAC PT "6 Clicks" Daily Activity  Outcome Measure Difficulty turning over in bed (including adjusting bedclothes, sheets and blankets)?: A Little Difficulty moving from lying on back to sitting on the side of the bed? : A Little Difficulty sitting down on and standing up from a chair with arms (e.g., wheelchair, bedside commode, etc,.)?: Unable Help needed moving to and from a bed to chair (including a wheelchair)?: A Little Help needed walking in hospital room?: A Little Help needed climbing 3-5 steps with a railing? : A Lot 6 Click Score: 15    End of Session Equipment Utilized During Treatment: Gait belt;Left knee immobilizer Activity Tolerance: Patient tolerated treatment well Patient left: in chair;with call bell/phone within reach;with family/visitor present Nurse  Communication: Mobility status PT Visit Diagnosis: Other abnormalities of gait and mobility (R26.89);Pain Pain - Right/Left: Left Pain - part of body: Knee    Time: 0930-1020 PT Time Calculation (min) (ACUTE ONLY): 50 min   Charges:   PT Evaluation $PT Eval Moderate Complexity: 1 Mod PT Treatments $Gait Training: 8-22 mins $Therapeutic Activity: 8-22 mins   PT G Codes:        Van ClinesHolly Chigozie Basaldua, PT  Acute Rehabilitation Services Pager [phone] Office [phone]   Raven AlandHolly H Allahna Husband 02/25/2018, 11:10 AM

## 2018-02-26 ENCOUNTER — Other Ambulatory Visit: Payer: Self-pay

## 2018-02-26 ENCOUNTER — Encounter (HOSPITAL_COMMUNITY): Payer: Self-pay | Admitting: General Practice

## 2018-02-26 MED ORDER — RIVAROXABAN 10 MG PO TABS: 10.0000 mg | ORAL_TABLET | Freq: Every day | ORAL | 0 refills | Status: AC

## 2018-02-26 MED ORDER — DOCUSATE SODIUM 100 MG PO CAPS: 100.0000 mg | ORAL_CAPSULE | Freq: Two times a day (BID) | ORAL | 0 refills | Status: AC

## 2018-02-26 NOTE — Evaluation (Signed)
Occupational Therapy Evaluation Patient Details Name: Raven Shaw MRN: [id number] DOB: 1961/11/17 Today's Date: 02/26/2018    History of Present Illness Admitted for LTKA, WBAT, KI ordered;  has a past medical history of Anxiety, Arthritis, Carpal tunnel syndrome, Diabetes mellitus without complication (HCC), GERD (gastroesophageal reflux disease), and Hypertension.    Clinical Impression   Pt admitted with the above diagnoses and presents with below problem list. Pt will benefit from continued acute OT to address the below listed deficits and maximize independence with BADLs prior to d/c to venue below. PTA pt was independent with ADLs. Pt is currently min guard to min A with LB ADLs and functional transfers/mobility. Educated on LB ADL strategies.      Follow Up Recommendations  No OT follow up;Supervision - Intermittent    Equipment Recommendations  3 in 1 bedside commode    Recommendations for Other Services       Precautions / Restrictions Precautions Precautions: Knee Precaution Booklet Issued: Yes (comment) Precaution Comments: Reviewed knee precautions Restrictions Weight Bearing Restrictions: Yes LLE Weight Bearing: Weight bearing as tolerated      Mobility Bed Mobility Overal bed mobility: Needs Assistance Bed Mobility: Supine to Sit;Sit to Supine     Supine to sit: Min assist Sit to supine: Min assist   General bed mobility comments: assist to advance LLE  Transfers Overall transfer level: Needs assistance Equipment used: Rolling walker (2 wheeled) Transfers: Sit to/from Stand Sit to Stand: Min guard         General transfer comment: VC required for correct hand placement on recliner and use of LE to allow for proper boost up and maximal safety.    Balance Overall balance assessment: Needs assistance Sitting-balance support: Feet supported;No upper extremity supported Sitting balance-Leahy Scale: Good Sitting balance - Comments: Patient able to  maintain upright sitting balance without use of UE, demonstrating good postural control.    Standing balance support: Bilateral upper extremity supported;During functional activity Standing balance-Leahy Scale: Poor Standing balance comment: Patient requires use of bilateral UE support in order to static stand                           ADL either performed or assessed with clinical judgement   ADL Overall ADL's : Needs assistance/impaired Eating/Feeding: Set up;Sitting   Grooming: Set up;Sitting   Upper Body Bathing: Set up;Sitting   Lower Body Bathing: Minimal assistance;Sit to/from stand   Upper Body Dressing : Set up;Sitting   Lower Body Dressing: Minimal assistance;Sit to/from stand   Toilet Transfer: Min guard;Ambulation;RW(3n1 over toilet)   Toileting- Clothing Manipulation and Hygiene: Set up;Minimal assistance;Sitting/lateral lean;Sit to/from stand   Tub/ Shower Transfer: Walk-in shower;Min guard;Ambulation;3 in 1;Rolling walker   Functional mobility during ADLs: Min guard;Rolling walker General ADL Comments: Pt completed ambulation to/from bathroom, toilet transfer and bed mobility     Vision         Perception     Praxis      Pertinent Vitals/Pain Pain Assessment: Faces Faces Pain Scale: Hurts whole lot Pain Location: L knee Pain Descriptors / Indicators: Aching;Grimacing Pain Intervention(s): Limited activity within patient's tolerance;Monitored during session;Repositioned;Other (comment)(reports just given pain med by mouth)     Hand Dominance     Extremity/Trunk Assessment Upper Extremity Assessment Upper Extremity Assessment: Overall WFL for tasks assessed   Lower Extremity Assessment Lower Extremity Assessment: Defer to PT evaluation       Communication     Cognition  Arousal/Alertness: Awake/alert Behavior During Therapy: WFL for tasks assessed/performed Overall Cognitive Status: Within Functional Limits for tasks assessed                                      General Comments       Exercises   Shoulder Instructions      Home Living Family/patient expects to be discharged to:: Private residence Living Arrangements: Spouse/significant other Available Help at Discharge: Family;Available PRN/intermittently Type of Home: House Home Access: Ramped entrance     Home Layout: Two level("she-cave" is 3 steps down) Alternate Level Stairs-Number of Steps: 3 Alternate Level Stairs-Rails: None Bathroom Shower/Tub: Producer, television/film/videoWalk-in shower   Bathroom Toilet: Handicapped height Bathroom Accessibility: Yes   Home Equipment: Shower seat(House already has some modifications for dtr)          Prior Functioning/Environment Level of Independence: Independent                 OT Problem List: Impaired balance (sitting and/or standing);Decreased knowledge of use of DME or AE;Decreased knowledge of precautions;Pain      OT Treatment/Interventions: Self-care/ADL training;DME and/or AE instruction;Therapeutic activities;Patient/family education;Balance training    OT Goals(Current goals can be found in the care plan section) Acute Rehab OT Goals Patient Stated Goal: back on her motorcycle OT Goal Formulation: With patient Time For Goal Achievement: 03/05/18 Potential to Achieve Goals: Good ADL Goals Pt Will Perform Grooming: with modified independence;standing Pt Will Perform Lower Body Bathing: with modified independence;sit to/from stand Pt Will Perform Lower Body Dressing: with modified independence;sit to/from stand Pt Will Transfer to Toilet: with modified independence;ambulating Pt Will Perform Toileting - Clothing Manipulation and hygiene: with modified independence;sit to/from stand Pt Will Perform Tub/Shower Transfer: Shower transfer;with supervision;ambulating;3 in 1;rolling walker Additional ADL Goal #1: Pt will complete bed mobiltiy at mod I level to prepare for OOB ADLs.  OT Frequency: Min  2X/week   Barriers to D/C:            Co-evaluation              AM-PAC PT "6 Clicks" Daily Activity     Outcome Measure Help from another person eating meals?: None Help from another person taking care of personal grooming?: A Little Help from another person toileting, which includes using toliet, bedpan, or urinal?: A Little Help from another person bathing (including washing, rinsing, drying)?: A Little Help from another person to put on and taking off regular upper body clothing?: None Help from another person to put on and taking off regular lower body clothing?: A Little 6 Click Score: 20   End of Session Equipment Utilized During Treatment: Gait belt;Rolling walker;Left knee immobilizer;Other (comment)(Pt's spouse donning KI upon therapist arrival. ) CPM Left Knee CPM Left Knee: Off  Activity Tolerance: Patient limited by pain Patient left: in bed;with call bell/phone within reach;with family/visitor present  OT Visit Diagnosis: Unsteadiness on feet (R26.81);Pain Pain - Right/Left: Left Pain - part of body: Knee                Time: [id number] OT Time Calculation (min): 15 min Charges:  OT General Charges $OT Visit: 1 Visit OT Evaluation $OT Eval Low Complexity: 1 Low G-Codes:     Pilar GrammesMathews, Starling Jessie H 02/26/2018, 12:18 PM

## 2018-02-26 NOTE — Care Management Note (Signed)
Case Management Note  Patient Details  Name: Raven Shaw MRN: [id number] Date of Birth: [birth year]  Subjective/Objective:  57 yr old female s/p left total knee arthroplasty.                   Action/Plan: Patient was preoperatively setup with [facility], no changes. Patient will have support at discharge.    Expected Discharge Date:  02/26/18               Expected Discharge Plan:  Home w Home Health Services  In-House Referral:  NA  Discharge planning Services  CM Consult  Post Acute Care Choice:  Durable Medical Equipment, Home Health Choice offered to:  Patient  DME Arranged:  Walker rolling, CPM DME Agency:  [employer]  HH Arranged:  PT [facility]  Status of Service:  Completed, signed off  If discussed at [employer] Meetings, dates discussed:    Additional Comments:  Durenda GuthrieBrady, Sakib Noguez Naomi, RN 02/26/2018, 12:30 PM

## 2018-02-26 NOTE — Progress Notes (Signed)
Physical Therapy Treatment Patient Details Name: Raven Shaw MRN: [id number] DOB: Jul 18, 1961 Today's Date: 02/26/2018    History of Present Illness Admitted for LTKA, WBAT, KI ordered;  has a past medical history of Anxiety, Arthritis, Carpal tunnel syndrome, Diabetes mellitus without complication (HCC), GERD (gastroesophageal reflux disease), and Hypertension.     PT Comments    Pt progressing slowly towards physical therapy goals. Pt is only achieving ~30 of ROM with active assist for range. Plan for second session this afternoon and initiation of stair training. Will continue to follow and progress as able per POC.    Follow Up Recommendations  Follow surgeon's recommendation for DC plan and follow-up therapies     Equipment Recommendations  Rolling walker with 5" wheels;3in1 (PT)    Recommendations for Other Services       Precautions / Restrictions Precautions Precautions: Knee Precaution Booklet Issued: Yes (comment) Precaution Comments: Provided total knee HEP. Reviewed knee precautions Restrictions Weight Bearing Restrictions: Yes LLE Weight Bearing: Weight bearing as tolerated    Mobility  Bed Mobility               General bed mobility comments: Patient seated in recliner upon PT arrival  Transfers Overall transfer level: Needs assistance Equipment used: Rolling walker (2 wheeled) Transfers: Sit to/from Stand Sit to Stand: Min guard         General transfer comment: VC required for correct hand placement on recliner and use of LE to allow for proper boost up and maximal safety.  Ambulation/Gait Ambulation/Gait assistance: Min guard Ambulation Distance (Feet): 80 Feet Assistive device: Rolling walker (2 wheeled) Gait Pattern/deviations: Step-to pattern;Step-through pattern;Decreased stride length;Trunk flexed;Narrow base of support Gait velocity: decreased Gait velocity interpretation: Below normal speed for age/gender General Gait Details:  Patient gait speed limited by pain. Consistent VC for sequencing and step through gait pattern. VC required for maintenance of upright posture and full knee extension of L LE during heel strike. Chair follow utilized for overall patient safety.   Stairs            Wheelchair Mobility    Modified Rankin (Stroke Patients Only)       Balance Overall balance assessment: Needs assistance Sitting-balance support: Feet supported;No upper extremity supported Sitting balance-Leahy Scale: Good Sitting balance - Comments: Patient able to maintain upright sitting balance without use of UE, demonstrating good postural control.    Standing balance support: Bilateral upper extremity supported;During functional activity Standing balance-Leahy Scale: Poor Standing balance comment: Patient requires use of bilateral UE support in order to stand upright                            Cognition Arousal/Alertness: Awake/alert Behavior During Therapy: WFL for tasks assessed/performed Overall Cognitive Status: Within Functional Limits for tasks assessed                                        Exercises Total Joint Exercises Ankle Circles/Pumps: AROM;Both;10 reps;Supine Heel Slides: AROM;AAROM;Left;10 reps;Seated Knee Flexion: 5 reps(stretch)    General Comments        Pertinent Vitals/Pain Pain Assessment: Faces Faces Pain Scale: Hurts even more Pain Location: L knee Pain Descriptors / Indicators: Aching;Grimacing Pain Intervention(s): Limited activity within patient's tolerance;Monitored during session    Home Living  Prior Function            PT Goals (current goals can now be found in the care plan section) Acute Rehab PT Goals Patient Stated Goal: back on her motorcycle PT Goal Formulation: With patient Time For Goal Achievement: 03/04/18 Potential to Achieve Goals: Good Progress towards PT goals: Progressing toward  goals    Frequency    7X/week      PT Plan Equipment recommendations need to be updated    Co-evaluation              AM-PAC PT "6 Clicks" Daily Activity  Outcome Measure  Difficulty turning over in bed (including adjusting bedclothes, sheets and blankets)?: A Little Difficulty moving from lying on back to sitting on the side of the bed? : A Little Difficulty sitting down on and standing up from a chair with arms (e.g., wheelchair, bedside commode, etc,.)?: A Little Help needed moving to and from a bed to chair (including a wheelchair)?: A Little Help needed walking in hospital room?: A Little Help needed climbing 3-5 steps with a railing? : A Lot 6 Click Score: 17    End of Session Equipment Utilized During Treatment: Gait belt Activity Tolerance: Patient limited by pain Patient left: in chair;with call bell/phone within reach;with family/visitor present Nurse Communication: Mobility status PT Visit Diagnosis: Other abnormalities of gait and mobility (R26.89);Pain Pain - Right/Left: Left Pain - part of body: Knee     Time: 0800-0825 PT Time Calculation (min) (ACUTE ONLY): 25 min  Charges:  $Gait Training: 8-22 mins $Therapeutic Activity: 8-22 mins                    G Codes:       Raven SlipperLaura Laverle Shaw, PT, DPT Acute Rehabilitation Services Pager: [phone]    Raven PearsonLaura D Irish Shaw 02/26/2018, 10:05 AM

## 2018-02-26 NOTE — Progress Notes (Signed)
Physical Therapy Treatment Patient Details Name: Raven CabalDebra E Shaw MRN: [id number] DOB: 02-09-1961 Today's Date: 02/26/2018    History of Present Illness Admitted for LTKA, WBAT, KI ordered;  has a past medical history of Anxiety, Arthritis, Carpal tunnel syndrome, Diabetes mellitus without complication (HCC), GERD (gastroesophageal reflux disease), and Hypertension.     PT Comments    Pt progressing well towards physical therapy goals. Was able to perform transfers and ambulation with gross min guard assist for balance support and safety. Pt continues to have difficulty with knee flexion and noted hip hike and circumduction during gait training to compensate. Encouraged proper positioning with towel roll under ankle, and bone foam when pt was able to tolerate. Will continue to follow and progress as able per POC.    Follow Up Recommendations  Follow surgeon's recommendation for DC plan and follow-up therapies     Equipment Recommendations  Rolling walker with 5" wheels;3in1 (PT)    Recommendations for Other Services       Precautions / Restrictions Precautions Precautions: Knee Precaution Booklet Issued: Yes (comment) Precaution Comments: Reviewed knee precautions Restrictions Weight Bearing Restrictions: Yes LLE Weight Bearing: Weight bearing as tolerated    Mobility  Bed Mobility Overal bed mobility: Needs Assistance Bed Mobility: Supine to Sit;Sit to Supine     Supine to sit: Min assist Sit to supine: Min assist   General bed mobility comments: Patient seated EOB upon treatment arrival   Transfers Overall transfer level: Needs assistance Equipment used: Rolling walker (2 wheeled) Transfers: Sit to/from Stand Sit to Stand: Min guard         General transfer comment: VC required for proper hand placement to allow for safe boost up from bed.   Ambulation/Gait Ambulation/Gait assistance: Min guard Ambulation Distance (Feet): 275 Feet(125;150 with rest breaks in  between ) Assistive device: Rolling walker (2 wheeled) Gait Pattern/deviations: Step-to pattern;Step-through pattern;Decreased stride length;Narrow base of support;Trunk flexed Gait velocity: decreased Gait velocity interpretation: Below normal speed for age/gender General Gait Details: Patient requires consistent VC for upright posture and heel strike during gait. Education provided on initiating greater knee flexion during swing phase, to avoid circumduction. Limited by pain and recliner was followed for rest breaks.    Stairs Stairs: Yes   Stair Management: With walker Number of Stairs: 6 General stair comments: Patient was educated on use of RW with stair navigation. Husband was educated on guarding and sequencing involved during stair navigation. Handout was provided and verbalized with  husband and patient.  Wheelchair Mobility    Modified Rankin (Stroke Patients Only)       Balance Overall balance assessment: Needs assistance Sitting-balance support: Feet supported;No upper extremity supported Sitting balance-Leahy Scale: Good Sitting balance - Comments: Patient able to maintain upright sitting balance without use of UE, demonstrating good postural control.    Standing balance support: Bilateral upper extremity supported;During functional activity Standing balance-Leahy Scale: Poor Standing balance comment: Patient requires use of bilateral UE support in order to static stand                            Cognition Arousal/Alertness: Awake/alert Behavior During Therapy: WFL for tasks assessed/performed Overall Cognitive Status: Within Functional Limits for tasks assessed                                        Exercises Total  Joint Exercises Quad Sets: AAROM;Left;5 reps;Seated Short Arc Quad: AAROM;5 reps;Seated Hip ABduction/ADduction: AAROM;5 reps;Left;Seated    General Comments        Pertinent Vitals/Pain Pain Assessment:  Faces Faces Pain Scale: Hurts even more Pain Location: L knee Pain Descriptors / Indicators: Aching;Grimacing Pain Intervention(s): Limited activity within patient's tolerance;Monitored during session    Home Living Family/patient expects to be discharged to:: Private residence Living Arrangements: Spouse/significant other Available Help at Discharge: Family;Available PRN/intermittently Type of Home: House Home Access: Ramped entrance   Home Layout: Two level("she-cave" is 3 steps down) Home Equipment: Shower seat      Prior Function Level of Independence: Independent          PT Goals (current goals can now be found in the care plan section) Acute Rehab PT Goals Patient Stated Goal: back on her motorcycle PT Goal Formulation: With patient Time For Goal Achievement: 03/04/18 Potential to Achieve Goals: Good Progress towards PT goals: Progressing toward goals    Frequency    7X/week      PT Plan Current plan remains appropriate    Co-evaluation              AM-PAC PT "6 Clicks" Daily Activity  Outcome Measure  Difficulty turning over in bed (including adjusting bedclothes, sheets and blankets)?: A Little Difficulty moving from lying on back to sitting on the side of the bed? : A Little Difficulty sitting down on and standing up from a chair with arms (e.g., wheelchair, bedside commode, etc,.)?: A Little Help needed moving to and from a bed to chair (including a wheelchair)?: A Little Help needed walking in hospital room?: A Little Help needed climbing 3-5 steps with a railing? : A Lot 6 Click Score: 17    End of Session Equipment Utilized During Treatment: Gait belt Activity Tolerance: Patient limited by pain Patient left: in chair;with call bell/phone within reach;with family/visitor present Nurse Communication: Mobility status PT Visit Diagnosis: Unsteadiness on feet (R26.81);Pain;Other abnormalities of gait and mobility (R26.89) Pain - Right/Left:  Left Pain - part of body: Knee     Time: 1345-1418 PT Time Calculation (min) (ACUTE ONLY): 33 min  Charges:  $Gait Training: 8-22 mins $Therapeutic Exercise: 8-22 mins                    G Codes:       Conni Slipper, PT, DPT Acute Rehabilitation Services Pager: [phone]    [name] 02/26/2018, 2:38 PM

## 2018-02-26 NOTE — Progress Notes (Signed)
Subjective: Patient stable.  Pain reasonably well controlled   Objective: Vital signs in last 24 hours: Temp:  [98.6 F (37 C)-101.5 F (38.6 C)] 101.5 F (38.6 C) (03/21 0451) Pulse Rate:  [121-128] 128 (03/21 0451) Resp:  [18] 18 (03/20 1220) BP: (135-159)/(80-81) 159/81 (03/21 0451) SpO2:  [94 %-100 %] 97 % (03/21 0451)  Intake/Output from previous day: 03/20 0701 - 03/21 0700 In: 480 [P.O.:480] Out: -  Intake/Output this shift: No intake/output data recorded.  Exam:  Dorsiflexion/Plantar flexion intact  Labs: No results for input(s): HGB in the last 72 hours. No results for input(s): WBC, RBC, HCT, PLT in the last 72 hours. No results for input(s): NA, K, CL, CO2, BUN, CREATININE, GLUCOSE, CALCIUM in the last 72 hours. No results for input(s): LABPT, INR in the last 72 hours.  Assessment/Plan: Plan discharge today after physical therapy this afternoon.  Continue with CPM machine use at home as well as home health physical therapy   Burnard BuntingG Scott Dean 02/26/2018, 11:16 AM

## 2018-02-26 NOTE — Progress Notes (Signed)
[name] to be D/C'd Home per MD order.  Discussed prescriptions and follow up appointments with the patient. Prescriptions given to patient, medication list explained in detail. Pt verbalized understanding.  Allergies as of 02/26/2018      Reactions   Ace Inhibitors Swelling   Lisinopril Swelling, Other (See Comments)   angioedema   Keflex [cephalexin] Swelling, Rash   SWELLING REACTION UNSPECIFIED       Medication List    STOP taking these medications   aspirin EC 81 MG tablet   estradiol 2 MG tablet Commonly known as:  ESTRACE   Fish Oil 1000 MG Caps   ICY HOT EX   nabumetone 500 MG tablet Commonly known as:  RELAFEN   predniSONE 10 MG (21) Tbpk tablet Commonly known as:  STERAPRED UNI-PAK 21 TAB     TAKE these medications   acetaminophen 500 MG tablet Commonly known as:  TYLENOL Take 500 mg by mouth 2 (two) times daily as needed for moderate pain. What changed:  Another medication with the same name was removed. Continue taking this medication, and follow the directions you see here.   ALPRAZolam 0.5 MG tablet Commonly known as:  XANAX Take 0.5 mg by mouth daily. May take a second 0.5 mg dose as needed for anxiety   amLODipine 10 MG tablet Commonly known as:  NORVASC Take 10 mg by mouth daily.   CONTOUR NEXT TEST test strip Generic drug:  glucose blood USE TO CHECK BLOOD SUGARS IN THE MORNING WHEN FASTING AND AS NEEDED   Cyanocobalamin 1500 MCG Tbdp Take 1,500 mcg by mouth daily.   cyclobenzaprine 10 MG tablet Commonly known as:  FLEXERIL Take 10 mg by mouth up to 3 times daily as needed for muscle spasms   docusate sodium 100 MG capsule Commonly known as:  COLACE Take 1 capsule (100 mg total) by mouth 2 (two) times daily.   FLUoxetine 20 MG capsule Commonly known as:  PROZAC Take 20 mg by mouth at bedtime.   losartan 50 MG tablet Commonly known as:  COZAAR Take 50 mg by mouth daily.   metFORMIN 500 MG tablet Commonly known as:   GLUCOPHAGE Take 250-500 mg by mouth See admin instructions. Take 500 mg in the morning and 250 mg at night   metoprolol succinate 25 MG 24 hr tablet Commonly known as:  TOPROL-XL Take 25 mg by mouth daily.   omeprazole 20 MG capsule Commonly known as:  PRILOSEC TAKE 1 TABLET BY MOUTH ONCE A DAY FOR 90 DAYS   rivaroxaban 10 MG Tabs tablet Commonly known as:  XARELTO Take 1 tablet (10 mg total) by mouth daily with breakfast. Start taking on:  02/27/2018   VISINE OP Place 2 drops into both eyes daily as needed (dry eyes).   Vitamin D 2000 units tablet Take 2,000 Units by mouth daily.       Vitals:   02/25/18 2038 02/26/18 0451  BP: 135/81 (!) 159/81  Pulse: (!) 121 (!) 128  Resp:    Temp: 99.5 F (37.5 C) (!) 101.5 F (38.6 C)  SpO2: 94% 97%    Skin clean, dry and intact without evidence of skin break down, no evidence of skin tears noted. Aquacel dressing clean, dry, and intact.IV catheter discontinued intact. Site without signs and symptoms of complications. Dressing and pressure applied. Pt denies pain at this time. No complaints noted.  An After Visit Summary and prescriptions were printed and given to the patient. Patient escorted via  WC, and D/C home via private auto.  GrenadaBrittany Remijio Holleran RN

## 2018-02-27 ENCOUNTER — Encounter (HOSPITAL_COMMUNITY): Payer: Self-pay | Admitting: Orthopedic Surgery

## 2018-02-27 ENCOUNTER — Telehealth (INDEPENDENT_AMBULATORY_CARE_PROVIDER_SITE_OTHER): Payer: Self-pay | Admitting: Orthopedic Surgery

## 2018-02-27 NOTE — Telephone Encounter (Signed)
Ok for order?  

## 2018-02-27 NOTE — Telephone Encounter (Signed)
[facility]  [phone] Verbal orders    Pt started therapy today    Home health Therapy  One time a week for one week   Three times a week for two weeks

## 2018-02-27 NOTE — Discharge Summary (Signed)
Physician Discharge Summary  Patient ID: Raven Shaw MRN: [id number] DOB/AGE: January 14, 1961 57 y.o.  Admit date: 02/24/2018 Discharge date: 02/26/2018  Admission Diagnoses:  Active Problems:   Arthritis of knee   Discharge Diagnoses:  Same  Surgeries: Procedure(s): LEFT TOTAL KNEE ARTHROPLASTY on 02/24/2018   Consultants:   Discharged Condition: Stable  Hospital Course: Raven Shaw is an 57 y.o. female who was admitted 02/24/2018 with a chief complaint of Left knee pain, and found to have a diagnosis of left knee arthritis.  They were brought to the operating room on 02/24/2018 and underwent the above named procedures.  Patient tolerated the procedure well without immediate complications.  She was seen by physical therapy on postop day #1.  She was started on CPM machine for range of motion exercises.  Her pain was controlled and she was able to ambulate in the hallway using a walker by the time of discharge.  She is discharged in good condition and will follow-up with me in 10 days for clinical recheck she is discharged home on DVT prophylaxis pain medicine and muscle relaxers.  Antibiotics given:  Anti-infectives (From admission, onward)   Start     Dose/Rate Route Frequency Ordered Stop   02/24/18 2200  vancomycin (VANCOCIN) IVPB 1000 mg/200 mL premix     1,000 mg 200 mL/hr over 60 Minutes Intravenous Every 12 hours 02/24/18 1801 02/24/18 2243   02/24/18 1200  clindamycin (CLEOCIN) IVPB 900 mg     900 mg 100 mL/hr over 30 Minutes Intravenous To [facility] 02/24/18 0653 02/24/18 1416    .  Recent vital signs:  Vitals:   02/25/18 2038 02/26/18 0451  BP: 135/81 (!) 159/81  Pulse: (!) 121 (!) 128  Resp:    Temp: 99.5 F (37.5 C) (!) 101.5 F (38.6 C)  SpO2: 94% 97%    Recent laboratory studies:  Results for orders placed or performed during the hospital encounter of 02/24/18  Glucose, capillary  Result Value Ref Range   Glucose-Capillary 118 (H) 65 - 99 mg/dL   Glucose, capillary  Result Value Ref Range   Glucose-Capillary 102 (H) 65 - 99 mg/dL  Glucose, capillary  Result Value Ref Range   Glucose-Capillary 112 (H) 65 - 99 mg/dL    Discharge Medications:   Allergies as of 02/26/2018      Reactions   Ace Inhibitors Swelling   Lisinopril Swelling, Other (See Comments)   angioedema   Keflex [cephalexin] Swelling, Rash   SWELLING REACTION UNSPECIFIED       Medication List    STOP taking these medications   aspirin EC 81 MG tablet   estradiol 2 MG tablet Commonly known as:  ESTRACE   Fish Oil 1000 MG Caps   ICY HOT EX   nabumetone 500 MG tablet Commonly known as:  RELAFEN   predniSONE 10 MG (21) Tbpk tablet Commonly known as:  STERAPRED UNI-PAK 21 TAB     TAKE these medications   acetaminophen 500 MG tablet Commonly known as:  TYLENOL Take 500 mg by mouth 2 (two) times daily as needed for moderate pain. What changed:  Another medication with the same name was removed. Continue taking this medication, and follow the directions you see here.   ALPRAZolam 0.5 MG tablet Commonly known as:  XANAX Take 0.5 mg by mouth daily. May take a second 0.5 mg dose as needed for anxiety   amLODipine 10 MG tablet Commonly known as:  NORVASC Take 10 mg by mouth daily.  CONTOUR NEXT TEST test strip Generic drug:  glucose blood USE TO CHECK BLOOD SUGARS IN THE MORNING WHEN FASTING AND AS NEEDED   Cyanocobalamin 1500 MCG Tbdp Take 1,500 mcg by mouth daily.   cyclobenzaprine 10 MG tablet Commonly known as:  FLEXERIL Take 10 mg by mouth up to 3 times daily as needed for muscle spasms   docusate sodium 100 MG capsule Commonly known as:  COLACE Take 1 capsule (100 mg total) by mouth 2 (two) times daily.   FLUoxetine 20 MG capsule Commonly known as:  PROZAC Take 20 mg by mouth at bedtime.   losartan 50 MG tablet Commonly known as:  COZAAR Take 50 mg by mouth daily.   metFORMIN 500 MG tablet Commonly known as:  GLUCOPHAGE Take  250-500 mg by mouth See admin instructions. Take 500 mg in the morning and 250 mg at night   metoprolol succinate 25 MG 24 hr tablet Commonly known as:  TOPROL-XL Take 25 mg by mouth daily.   omeprazole 20 MG capsule Commonly known as:  PRILOSEC TAKE 1 TABLET BY MOUTH ONCE A DAY FOR 90 DAYS   rivaroxaban 10 MG Tabs tablet Commonly known as:  XARELTO Take 1 tablet (10 mg total) by mouth daily with breakfast.   VISINE OP Place 2 drops into both eyes daily as needed (dry eyes).   Vitamin D 2000 units tablet Take 2,000 Units by mouth daily.       Diagnostic Studies: No results found.  Disposition:   Discharge Instructions    Call MD / Call 911   Complete by:  As directed    If you experience chest pain or shortness of breath, CALL 911 and be transported to the hospital emergency room.  If you develope a fever above 101 F, pus (white drainage) or increased drainage or redness at the wound, or calf pain, call your surgeon's office.   Constipation Prevention   Complete by:  As directed    Drink plenty of fluids.  Prune juice may be helpful.  You may use a stool softener, such as Colace (over the counter) 100 mg twice a day.  Use MiraLax (over the counter) for constipation as needed.   Diet - low sodium heart healthy   Complete by:  As directed    Discharge instructions   Complete by:  As directed    CPM machine 1 hour 3 times a day increasing degrees daily Okay to weight-bear as tolerated on the left leg but use the knee immobilizer when walking around Okay to shower.  Dressing waterproof Remove dressing on 03/07/2018.  Okay to shower with the dressing off at that time   Increase activity slowly as tolerated   Complete by:  As directed       Follow-up Information    [facility].   Specialty:  Home Health Services Why:  A representative from [facility] will contact you to arrange start date and time for your therapy. Contact information: [address] [place] [address] [phone]            Signed: Burnard Bunting 02/27/2018, 11:15 AM

## 2018-02-28 NOTE — Telephone Encounter (Signed)
y

## 2018-03-02 NOTE — Telephone Encounter (Signed)
I called and advised. 

## 2018-03-10 ENCOUNTER — Telehealth (INDEPENDENT_AMBULATORY_CARE_PROVIDER_SITE_OTHER): Payer: Self-pay | Admitting: Orthopedic Surgery

## 2018-03-10 NOTE — Telephone Encounter (Signed)
IC advised unable to provide that information as we do not have a signed release from patient that it is ok for us to speak with them.

## 2018-03-10 NOTE — Telephone Encounter (Signed)
Nope, I don't have anything other than that one.

## 2018-03-10 NOTE — Telephone Encounter (Signed)
Do you by chance have any other authorizations in chart for this patient? The only one I see is the one for [facility]. Vincenza HewsShane that called and left this message is from [employer].

## 2018-03-10 NOTE — Telephone Encounter (Signed)
How long do you want to keep patient Raven Shaw?

## 2018-03-10 NOTE — Telephone Encounter (Signed)
3 mos

## 2018-03-10 NOTE — Telephone Encounter (Signed)
Raven Shaw called asking for the patients ICD10 code from the surgery and also wanted to know the return to work status. CB # [phone]

## 2018-03-12 ENCOUNTER — Ambulatory Visit (INDEPENDENT_AMBULATORY_CARE_PROVIDER_SITE_OTHER): Payer: Managed Care, Other (non HMO)

## 2018-03-12 ENCOUNTER — Ambulatory Visit (INDEPENDENT_AMBULATORY_CARE_PROVIDER_SITE_OTHER): Payer: Managed Care, Other (non HMO) | Admitting: Orthopedic Surgery

## 2018-03-12 ENCOUNTER — Telehealth (INDEPENDENT_AMBULATORY_CARE_PROVIDER_SITE_OTHER): Payer: Self-pay | Admitting: Orthopedic Surgery

## 2018-03-12 ENCOUNTER — Encounter (INDEPENDENT_AMBULATORY_CARE_PROVIDER_SITE_OTHER): Payer: Self-pay | Admitting: Orthopedic Surgery

## 2018-03-12 DIAGNOSIS — Z96652 Presence of left artificial knee joint: Secondary | ICD-10-CM | POA: Diagnosis not present

## 2018-03-12 MED ORDER — OXYCODONE HCL 5 MG PO TABS: ORAL_TABLET | ORAL | 0 refills | Status: AC

## 2018-03-12 NOTE — Telephone Encounter (Signed)
Please write script for patient for [facility] Thanks.

## 2018-03-12 NOTE — Telephone Encounter (Signed)
Patient left a vm saying her PT told her she would need a referral from Dr. August Saucerean for outpatient physical therapy.

## 2018-03-13 NOTE — Telephone Encounter (Signed)
Done pls calal htx

## 2018-03-13 NOTE — Telephone Encounter (Signed)
Faxed order to [facility] Patient will call to schedule appt.

## 2018-03-16 ENCOUNTER — Encounter (INDEPENDENT_AMBULATORY_CARE_PROVIDER_SITE_OTHER): Payer: Self-pay | Admitting: Orthopedic Surgery

## 2018-03-16 NOTE — Progress Notes (Signed)
   Post-Op Visit Note   Patient: Raven Shaw           Date of Birth: 1961/09/29           MRN: [id number] Visit Date: 03/12/2018 PCP: Farris HasMorrow, Aaron, MD   Assessment & Plan:  Chief Complaint:  Chief Complaint  Patient presents with  . Left Knee - Routine Post Op   Visit Diagnoses:  1. Status post total left knee replacement     Plan: Raven Shaw is a patient who is now 2 weeks out left total knee replacement.  CPM machine is up to 70 degrees.  Taking oxycodone for pain and that is refilled.  She is walking heel to toe.  On examination she does have flexion to about 70 degrees.  I like it to be a little bit more.  She has good extension.  Not much in the way of swelling.  All in all her gait pattern looks good but I would like her to get more flexion.  Outpatient therapy to begin soon.  Follow-up with me in 2 weeks so we can recheck flexion.  Would like it to be closer to 90 degrees at that point.  No calf tenderness today negative Homans.  Follow-Up Instructions: No follow-ups on file.   Orders:  Orders Placed This Encounter  Procedures  . XR Knee 1-2 Views Left  . Ambulatory referral to Physical Therapy   Meds ordered this encounter  Medications  . oxyCODONE (ROXICODONE) 5 MG immediate release tablet    Sig: 1 po q 8hrs prn pain    Dispense:  35 tablet    Refill:  0    Imaging: No results found.  PMFS History: Patient Active Problem List   Diagnosis Date Noted  . Arthritis of knee 02/24/2018   Past Medical History:  Diagnosis Date  . Anxiety   . Arthritis   . Carpal tunnel syndrome   . Diabetes mellitus without complication (HCC)   . GERD (gastroesophageal reflux disease)   . Hypertension     History reviewed. No pertinent family history.  Past Surgical History:  Procedure Laterality Date  . ABDOMINAL HYSTERECTOMY    . CESAREAN SECTION    . COLONOSCOPY    . DILATION AND CURETTAGE OF UTERUS    . JOINT REPLACEMENT    . TOTAL KNEE ARTHROPLASTY Left 02/24/2018   . TOTAL KNEE ARTHROPLASTY Left 02/24/2018   Procedure: LEFT TOTAL KNEE ARTHROPLASTY;  Surgeon: Cammy Copaean, Gregory Scott, MD;  Location[facility];  Service: Orthopedics;  Laterality: Left;   Social History   Occupational History  . Not on file  Tobacco Use  . Smoking status: Never Smoker  . Smokeless tobacco: Never Used  Substance and Sexual Activity  . Alcohol use: Yes    Alcohol/week: 10.2 oz    Types: 7 Cans of beer, 10 Shots of liquor per week    Comment: 1-2 glasses week of wine  . Drug use: No  . Sexual activity: Not on file

## 2018-03-25 ENCOUNTER — Encounter (INDEPENDENT_AMBULATORY_CARE_PROVIDER_SITE_OTHER): Payer: Self-pay | Admitting: Orthopedic Surgery

## 2018-03-25 ENCOUNTER — Ambulatory Visit (INDEPENDENT_AMBULATORY_CARE_PROVIDER_SITE_OTHER): Payer: Managed Care, Other (non HMO) | Admitting: Orthopedic Surgery

## 2018-03-25 DIAGNOSIS — Z96652 Presence of left artificial knee joint: Secondary | ICD-10-CM

## 2018-03-25 MED ORDER — OXYCODONE HCL 5 MG PO TABS: ORAL_TABLET | ORAL | 0 refills | Status: AC

## 2018-03-28 ENCOUNTER — Encounter (INDEPENDENT_AMBULATORY_CARE_PROVIDER_SITE_OTHER): Payer: Self-pay | Admitting: Orthopedic Surgery

## 2018-03-28 NOTE — Progress Notes (Signed)
   Post-Op Visit Note   Patient: Raven Shaw           Date of Birth: February 13, 1961           MRN: [id number] Visit Date: 03/25/2018 PCP: Farris HasMorrow, Aaron, MD   Assessment & Plan:  Chief Complaint:  Chief Complaint  Patient presents with  . Left Knee - Routine Post Op   Visit Diagnoses:  1. Status post total left knee replacement     Plan: Raven Shaw is a patient is now month out left total knee replacement.  Started outpatient physical therapy.  Taking oxycodone for pain.  On exam she still is lacking the amount of flexion I think she needs.  She is out straight but only gets it about 75 degrees.  We will give her 2 weeks and then if she is not really closer to 90 then will need to do manipulation.  Oxycodone refilled.  Follow-Up Instructions: Return in about 2 weeks (around 04/08/2018).   Orders:  No orders of the defined types were placed in this encounter.  Meds ordered this encounter  Medications  . oxyCODONE (ROXICODONE) 5 MG immediate release tablet    Sig: 1 PO Q 4HRS PRN PAIN    Dispense:  60 tablet    Refill:  0    Imaging: No results found.  PMFS History: Patient Active Problem List   Diagnosis Date Noted  . Arthritis of knee 02/24/2018   Past Medical History:  Diagnosis Date  . Anxiety   . Arthritis   . Carpal tunnel syndrome   . Diabetes mellitus without complication (HCC)   . GERD (gastroesophageal reflux disease)   . Hypertension     History reviewed. No pertinent family history.  Past Surgical History:  Procedure Laterality Date  . ABDOMINAL HYSTERECTOMY    . CESAREAN SECTION    . COLONOSCOPY    . DILATION AND CURETTAGE OF UTERUS    . JOINT REPLACEMENT    . TOTAL KNEE ARTHROPLASTY Left 02/24/2018  . TOTAL KNEE ARTHROPLASTY Left 02/24/2018   Procedure: LEFT TOTAL KNEE ARTHROPLASTY;  Surgeon: Cammy Copaean, Shakara Tweedy Scott, MD;  [facility];  Service: Orthopedics;  Laterality: Left;   Social History   Occupational History  . Not on file  Tobacco Use  .  Smoking status: Never Smoker  . Smokeless tobacco: Never Used  Substance and Sexual Activity  . Alcohol use: Yes    Alcohol/week: 10.2 oz    Types: 7 Cans of beer, 10 Shots of liquor per week    Comment: 1-2 glasses week of wine  . Drug use: No  . Sexual activity: Not on file

## 2018-04-09 ENCOUNTER — Encounter (INDEPENDENT_AMBULATORY_CARE_PROVIDER_SITE_OTHER): Payer: Self-pay | Admitting: Orthopedic Surgery

## 2018-04-09 ENCOUNTER — Ambulatory Visit (INDEPENDENT_AMBULATORY_CARE_PROVIDER_SITE_OTHER): Payer: Managed Care, Other (non HMO) | Admitting: Orthopedic Surgery

## 2018-04-09 DIAGNOSIS — Z96652 Presence of left artificial knee joint: Secondary | ICD-10-CM

## 2018-04-09 MED ORDER — AMOXICILLIN 500 MG PO TABS: ORAL_TABLET | ORAL | 0 refills | Status: AC

## 2018-04-11 ENCOUNTER — Encounter (INDEPENDENT_AMBULATORY_CARE_PROVIDER_SITE_OTHER): Payer: Self-pay | Admitting: Orthopedic Surgery

## 2018-04-11 NOTE — Progress Notes (Signed)
   Post-Op Visit Note   Patient: Raven Shaw           Date of Birth: 12-07-1961           MRN: [id number] Visit Date: 04/09/2018 PCP: Farris Has, MD   Assessment & Plan:  Chief Complaint:  Chief Complaint  Patient presents with  . Left Knee - Routine Post Op   Visit Diagnoses:  1. Status post total left knee replacement     Plan: Raven Shaw is a patient who is now 6 weeks out left total knee replacement.  Is going to physical therapy.  She achieved 94 degrees of flexion.  Ambulating with a cane but in general her walking pain is absent.  She would like to get her teeth cleaned but I would like her to wait at least 6 more weeks.  Antibiotics sent into the pharmacy for preoperative and preprocedure treatment.  On examination she has improved flexion to past 90 degrees.  That was the goal today was to get her past 90 degrees to avoid a manipulation.  Still has a soft endpoint so I think she will be able to get more motion.  Would like her to be at 100 degrees x 2 weeks.  We will see her back then for clinical recheck.  Pain medicine represcribed.  Follow-Up Instructions: Return in about 1 month (around 05/07/2018).   Orders:  No orders of the defined types were placed in this encounter.  Meds ordered this encounter  Medications  . amoxicillin (AMOXIL) 500 MG tablet    Sig: Take 2 grams 1 hour prior to dental procedure    Dispense:  16 tablet    Refill:  0    Imaging: No results found.  PMFS History: Patient Active Problem List   Diagnosis Date Noted  . Arthritis of knee 02/24/2018   Past Medical History:  Diagnosis Date  . Anxiety   . Arthritis   . Carpal tunnel syndrome   . Diabetes mellitus without complication (HCC)   . GERD (gastroesophageal reflux disease)   . Hypertension     History reviewed. No pertinent family history.  Past Surgical History:  Procedure Laterality Date  . ABDOMINAL HYSTERECTOMY    . CESAREAN SECTION    . COLONOSCOPY    .  DILATION AND CURETTAGE OF UTERUS    . JOINT REPLACEMENT    . TOTAL KNEE ARTHROPLASTY Left 02/24/2018  . TOTAL KNEE ARTHROPLASTY Left 02/24/2018   Procedure: LEFT TOTAL KNEE ARTHROPLASTY;  Surgeon: Cammy Copa, MD;  Location: [facility] OR;  Service: Orthopedics;  Laterality: Left;   Social History   Occupational History  . Not on file  Tobacco Use  . Smoking status: Never Smoker  . Smokeless tobacco: Never Used  Substance and Sexual Activity  . Alcohol use: Yes    Alcohol/week: 10.2 oz    Types: 7 Cans of beer, 10 Shots of liquor per week    Comment: 1-2 glasses week of wine  . Drug use: No  . Sexual activity: Not on file

## 2018-04-24 ENCOUNTER — Ambulatory Visit (INDEPENDENT_AMBULATORY_CARE_PROVIDER_SITE_OTHER): Payer: Managed Care, Other (non HMO) | Admitting: Orthopedic Surgery

## 2018-04-24 DIAGNOSIS — Z96652 Presence of left artificial knee joint: Secondary | ICD-10-CM

## 2018-04-25 ENCOUNTER — Encounter (INDEPENDENT_AMBULATORY_CARE_PROVIDER_SITE_OTHER): Payer: Self-pay | Admitting: Orthopedic Surgery

## 2018-04-25 NOTE — Progress Notes (Signed)
   Post-Op Visit Note   Patient: Raven Shaw           Date of Birth: 1961/06/06           MRN: [id number] Visit Date: 04/24/2018 PCP: Farris Has, MD   Assessment & Plan:  Chief Complaint:  Chief Complaint  Patient presents with  . Left Knee - Routine Post Op    TKA 02/24/18   Visit Diagnoses: No diagnosis found.  Plan: Raven Shaw is a patient underwent left total knee replacement 8 weeks ago.  She is doing well.  She is lost some weight since the surgery.  Denies any itching around the incision.  Uses a cane.  Been having little difficulty getting her flexion.  Plan is continue with physical therapy.  She really does not want to do a manipulation.  She can get to 95 degrees of flexion but has a normal gait and believe she is making progress.  I do want her to continue to work as diligently as possible on therapy.  I will see her back in 4 weeks for final check.  She needs to stay out of work for 4 more weeks just to work on range of motion and to increase her walking and standing endurance.  She still is using a cane.  Only takes oxycodone at night on some nights.  Follow-Up Instructions: Return in about 1 month (around 05/22/2018).   Orders:  No orders of the defined types were placed in this encounter.  No orders of the defined types were placed in this encounter.   Imaging: No results found.  PMFS History: Patient Active Problem List   Diagnosis Date Noted  . Arthritis of knee 02/24/2018   Past Medical History:  Diagnosis Date  . Anxiety   . Arthritis   . Carpal tunnel syndrome   . Diabetes mellitus without complication (HCC)   . GERD (gastroesophageal reflux disease)   . Hypertension     History reviewed. No pertinent family history.  Past Surgical History:  Procedure Laterality Date  . ABDOMINAL HYSTERECTOMY    . CESAREAN SECTION    . COLONOSCOPY    . DILATION AND CURETTAGE OF UTERUS    . JOINT REPLACEMENT    . TOTAL KNEE ARTHROPLASTY Left 02/24/2018  .  TOTAL KNEE ARTHROPLASTY Left 02/24/2018   Procedure: LEFT TOTAL KNEE ARTHROPLASTY;  Surgeon: Cammy Copa, MD;  Location: [facility];  Service: Orthopedics;  Laterality: Left;   Social History   Occupational History  . Not on file  Tobacco Use  . Smoking status: Never Smoker  . Smokeless tobacco: Never Used  Substance and Sexual Activity  . Alcohol use: Yes    Alcohol/week: 10.2 oz    Types: 7 Cans of beer, 10 Shots of liquor per week    Comment: 1-2 glasses week of wine  . Drug use: No  . Sexual activity: Not on file

## 2018-04-27 ENCOUNTER — Telehealth (INDEPENDENT_AMBULATORY_CARE_PROVIDER_SITE_OTHER): Payer: Self-pay | Admitting: Orthopedic Surgery

## 2018-04-27 NOTE — Telephone Encounter (Signed)
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 Please give patient a call when received. CB # [phone]

## 2018-05-05 ENCOUNTER — Telehealth (INDEPENDENT_AMBULATORY_CARE_PROVIDER_SITE_OTHER): Payer: Self-pay

## 2018-05-05 MED ORDER — HYDROCODONE-ACETAMINOPHEN 5-325 MG PO TABS: ORAL_TABLET | ORAL | 0 refills | Status: AC

## 2018-05-05 NOTE — Telephone Encounter (Signed)
Patient request rxrf on oxy. Please advise. Thanks.

## 2018-05-05 NOTE — Telephone Encounter (Signed)
Ok to rf norco 5 325 1 po q 8 - 12 # 30 pls clal thx

## 2018-05-05 NOTE — Telephone Encounter (Signed)
IC advised could pick up in the morning to take to pharmacy.

## 2018-05-05 NOTE — Telephone Encounter (Signed)
IC patient advised nothing has been received yet.

## 2018-05-05 NOTE — Telephone Encounter (Signed)
Have you seen these forms? 

## 2018-05-05 NOTE — Telephone Encounter (Signed)
No, I haven't received anything for her.

## 2018-05-05 NOTE — Addendum Note (Signed)
Addended byPrescott Parma on: 05/05/2018 01:02 PM   Modules accepted: Orders

## 2018-05-22 ENCOUNTER — Ambulatory Visit (INDEPENDENT_AMBULATORY_CARE_PROVIDER_SITE_OTHER): Payer: Managed Care, Other (non HMO) | Admitting: Orthopedic Surgery

## 2018-05-22 ENCOUNTER — Encounter (INDEPENDENT_AMBULATORY_CARE_PROVIDER_SITE_OTHER): Payer: Self-pay | Admitting: Orthopedic Surgery

## 2018-05-22 DIAGNOSIS — Z96652 Presence of left artificial knee joint: Secondary | ICD-10-CM

## 2018-05-23 ENCOUNTER — Encounter (INDEPENDENT_AMBULATORY_CARE_PROVIDER_SITE_OTHER): Payer: Self-pay | Admitting: Orthopedic Surgery

## 2018-05-23 NOTE — Progress Notes (Signed)
   Post-Op Visit Note   Patient: Raven Shaw           Date of Birth: 1961/01/29           MRN: [id number] Visit Date: 05/22/2018 PCP: Farris HasMorrow, Aaron, MD   Assessment & Plan:  Chief Complaint:  Chief Complaint  Patient presents with  . Left Knee - Follow-up   Visit Diagnoses:  1. Status post total left knee replacement     Plan: Raven Shaw is a patient who is now almost 3 months out left total knee replacement.  She is been doing well.  Doing exercises at home.  She ambulates without a cane.  She is not having any pain.  On examination she has full extension and about 100 degrees of flexion.  Her gait is normal.  She does clerical work.  I think she is okay to return to work 05/27/2018.  I will see her back as needed.  I think with continued exercise she would be able to get a little bit more motion.  Follow-Up Instructions: Return if symptoms worsen or fail to improve.   Orders:  No orders of the defined types were placed in this encounter.  No orders of the defined types were placed in this encounter.   Imaging: No results found.  PMFS History: Patient Active Problem List   Diagnosis Date Noted  . Arthritis of knee 02/24/2018   Past Medical History:  Diagnosis Date  . Anxiety   . Arthritis   . Carpal tunnel syndrome   . Diabetes mellitus without complication (HCC)   . GERD (gastroesophageal reflux disease)   . Hypertension     History reviewed. No pertinent family history.  Past Surgical History:  Procedure Laterality Date  . ABDOMINAL HYSTERECTOMY    . CESAREAN SECTION    . COLONOSCOPY    . DILATION AND CURETTAGE OF UTERUS    . JOINT REPLACEMENT    . TOTAL KNEE ARTHROPLASTY Left 02/24/2018  . TOTAL KNEE ARTHROPLASTY Left 02/24/2018   Procedure: LEFT TOTAL KNEE ARTHROPLASTY;  Surgeon: Cammy Copaean, Gregory Scott, MD;  [facility];  Service: Orthopedics;  Laterality: Left;   Social History   Occupational History  . Not on file  Tobacco Use  . Smoking status:  Never Smoker  . Smokeless tobacco: Never Used  Substance and Sexual Activity  . Alcohol use: Yes    Alcohol/week: 10.2 oz    Types: 7 Cans of beer, 10 Shots of liquor per week    Comment: 1-2 glasses week of wine  . Drug use: No  . Sexual activity: Not on file

## 2018-07-03 ENCOUNTER — Telehealth (INDEPENDENT_AMBULATORY_CARE_PROVIDER_SITE_OTHER): Payer: Self-pay | Admitting: Orthopedic Surgery

## 2018-07-03 NOTE — Telephone Encounter (Signed)
05/22/2018 [facility] [phone]

## 2018-11-14 IMAGING — MR MR KNEE*L* W/O CM
6 series · 36 of 40 positions shown · non-contrast
Comparison: None.

CLINICAL DATA: Chronic left knee pain.  No known injury.

EXAM:
MRI OF THE LEFT KNEE WITHOUT CONTRAST
TECHNIQUE: Multiplanar, multisequence MR imaging of the knee was performed. No
intravenous contrast was administered.

[Series 6: PD fat-sat · axial · left · 3.0mm · 0.39mm/px · z∈[-35,+79]mm · 8 of 36 slices shown (1 of 3)]
[im 1/36]
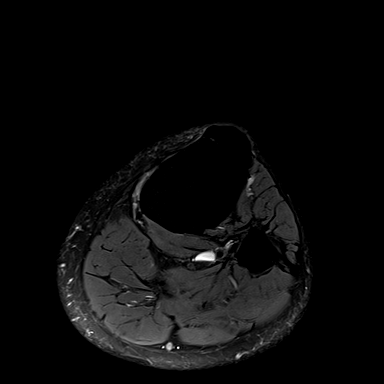
[im 6/36]
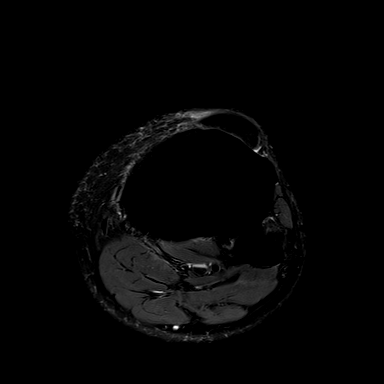
[im 11/36]
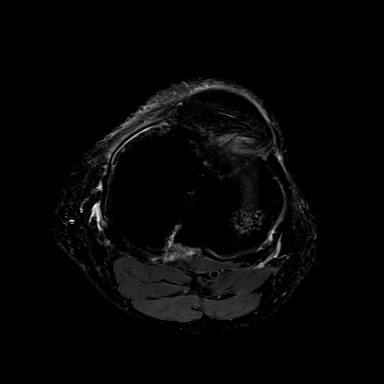
[im 16/36]
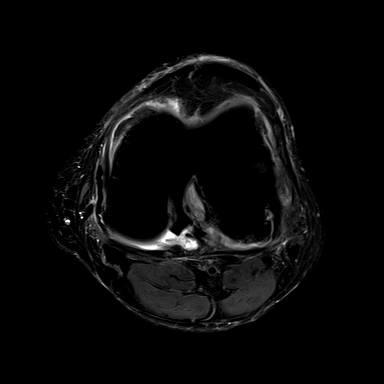
[im 21/36]
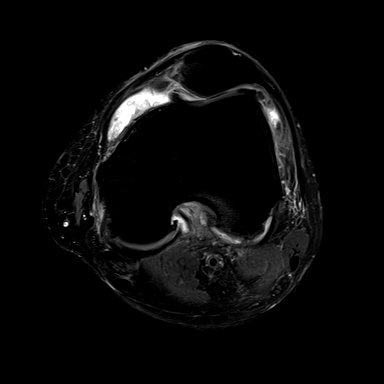
[im 26/36]
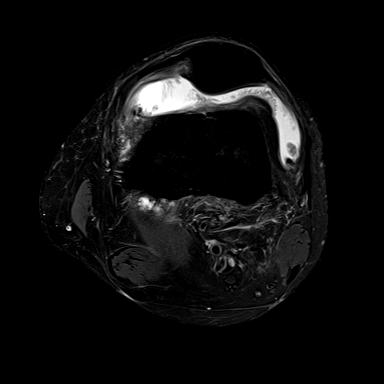
[im 31/36]
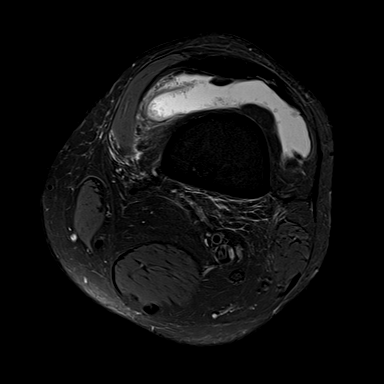
[im 36/36]
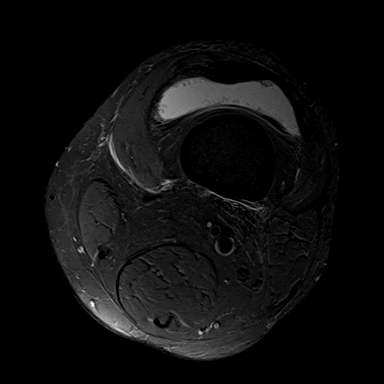

[Series 7: PD fat-sat · sagittal · left · 3.0mm · 0.39mm/px · 6 of 27 slices shown (2 of 3)]
[im 1/27]
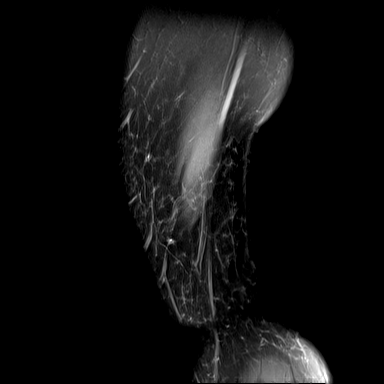
[im 6/27]
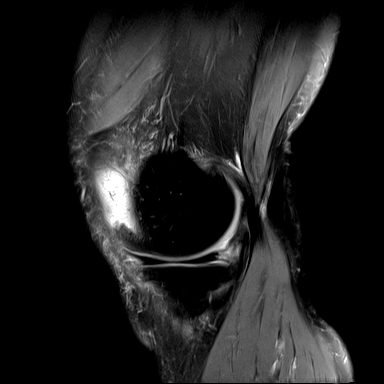
[im 11/27]
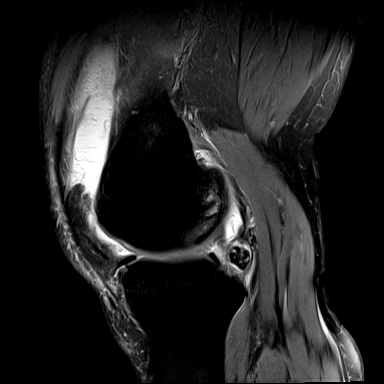
[im 16/27]
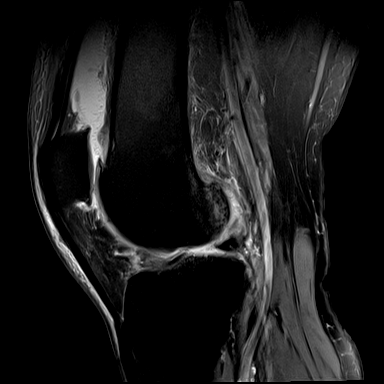
[im 21/27]
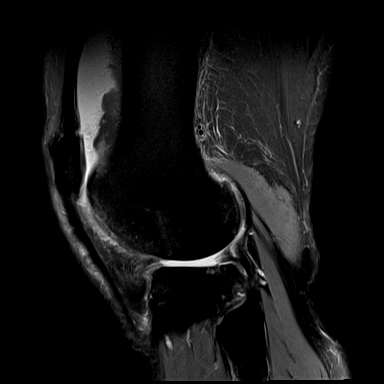
[im 27/27]
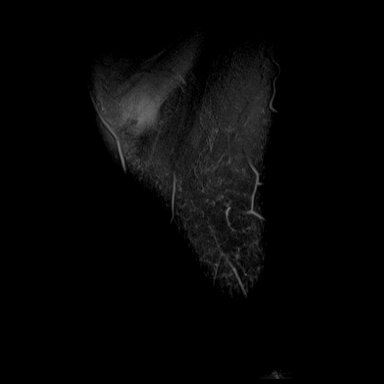

[Series 8: T1 · coronal · left · 3.0mm · 0.33mm/px · 3 of 30 slices shown]
[im 1/30]
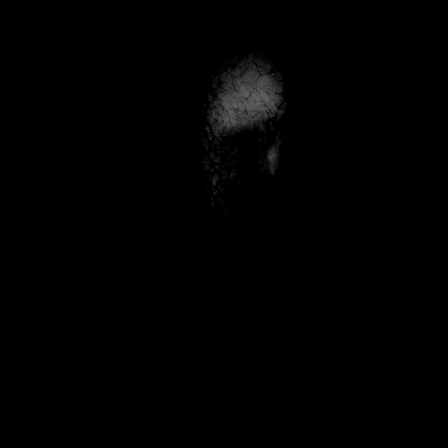
[im 5/30]
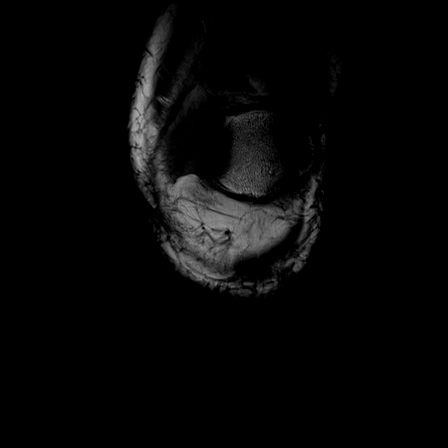
[im 10/30]
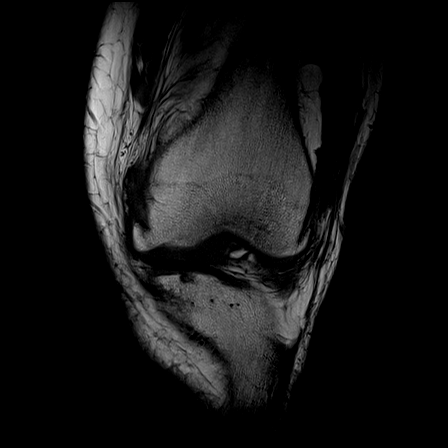

[Series 9: PD fat-sat · coronal · left · 3.0mm · 0.39mm/px · 7 of 30 slices shown (3 of 3)]
[im 1/30]
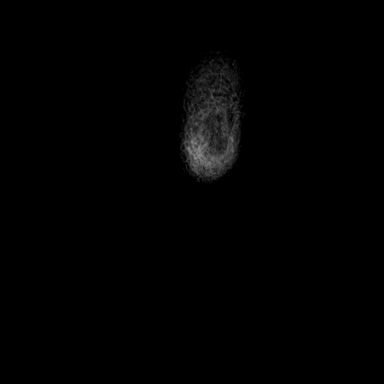
[im 5/30]
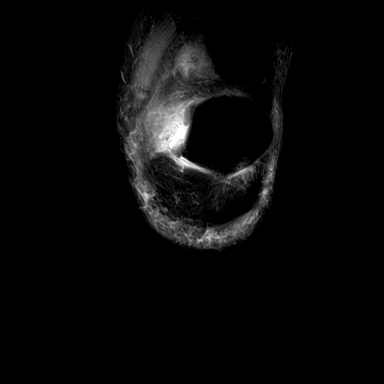
[im 10/30]
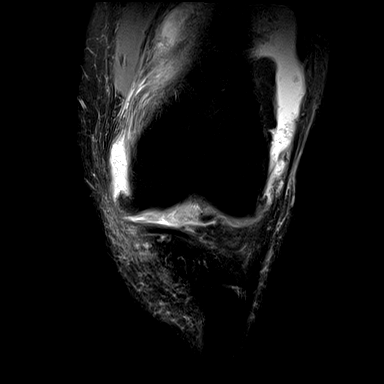
[im 15/30]
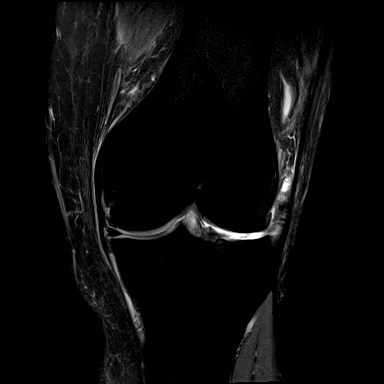
[im 20/30]
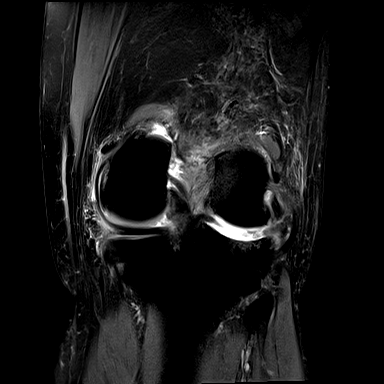
[im 25/30]
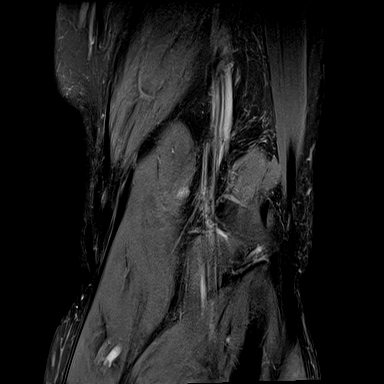
[im 30/30]
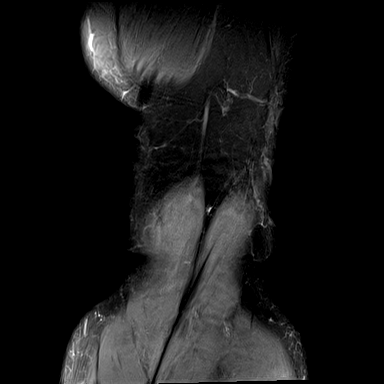

[Series 10: T2 fat-sat · coronal · left · 3.0mm · 0.47mm/px · 7 of 30 slices shown]
[im 1/30]
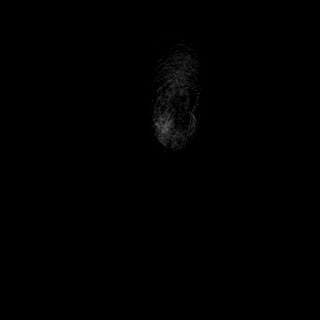
[im 5/30]
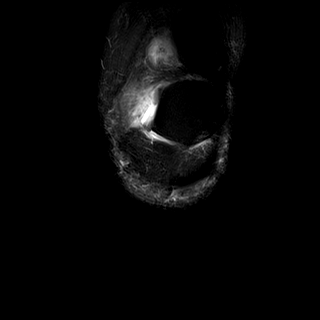
[im 10/30]
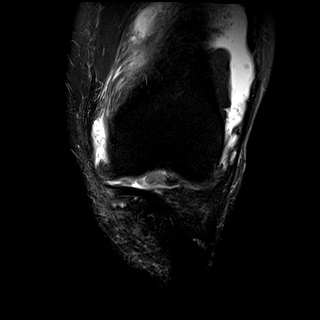
[im 15/30]
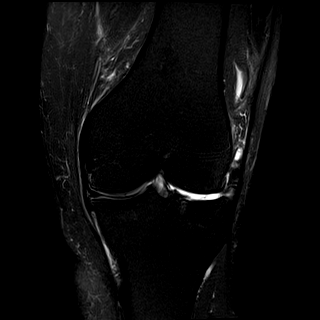
[im 20/30]
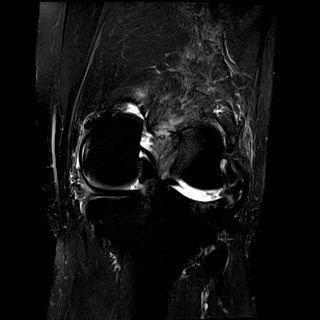
[im 25/30]
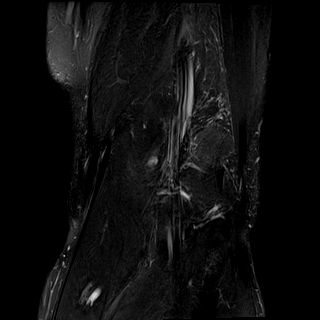
[im 30/30]
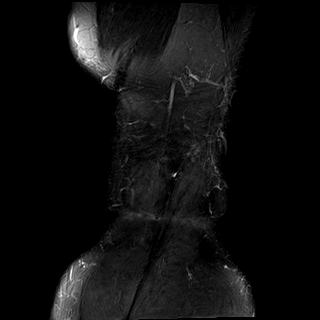

[Series 11: PD · coronal · left · 1.5mm · 0.44mm/px · 5 of 21 slices shown]
[im 1/21]
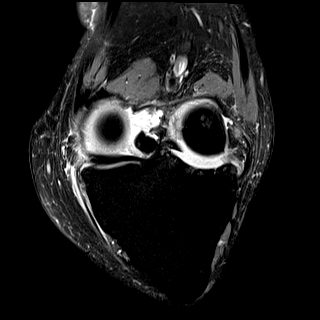
[im 6/21]
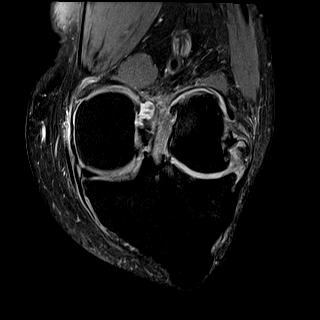
[im 11/21]
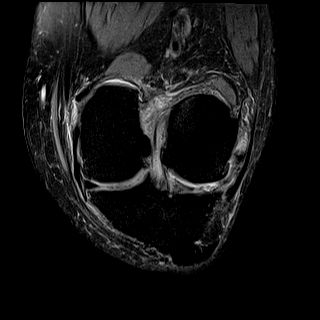
[im 16/21]
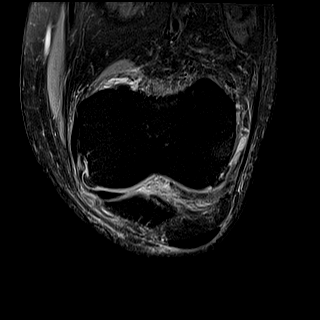
[im 21/21]
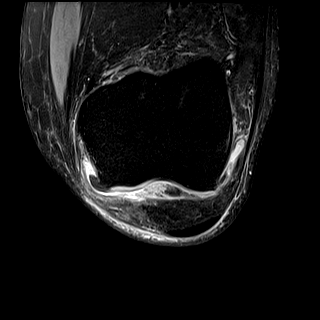

[36 of 40 positions shown; findings below may reference images not displayed]

FINDINGS: MENISCI

Medial meniscus:  Intact.

Lateral meniscus: The anterior horn is not visualized consistent
with degenerative maceration. There is complex tearing throughout a
severely degenerated body. The body is extruded peripherally.
Complex tearing is seen in the posterior horn with both horizontal
and radial components.

LIGAMENTS

Cruciates: Intact. Severe appearing mucoid degeneration of the ACL
is noted.

Collaterals:  Intact.

CARTILAGE

Patellofemoral: A small fissure in cartilage is seen at the patellar
apex.

Medial:  Mildly degenerated.

Lateral:  Completely denuded.

Joint: There is a moderate joint effusion with synovial thickening.
A loose body measuring 1 cm in diameter is seen just medial to the
PCL attachment to the tibia.

Popliteal Fossa:  No Baker's cyst.

Extensor Mechanism:  Intact.

Bones: No fracture or worrisome lesion. Osteophytosis is seen about
all 3 compartments.

Other: None
IMPRESSION: Dominant finding is osteoarthritis about the knee appearing worst in
the lateral compartment where it is severe. Associated degenerative
maceration throughout the anterior horn and most entire body is
present. There is complex tearing in the posterior horn. 1 cm loose
body in the posterior aspect of the joint is noted.

Joint effusion with synovial thickening consistent with synovitis.

Marked mucoid degeneration of the ACL without tear.

## 2019-02-07 ENCOUNTER — Other Ambulatory Visit (INDEPENDENT_AMBULATORY_CARE_PROVIDER_SITE_OTHER): Payer: Self-pay | Admitting: Orthopedic Surgery

## 2019-07-31 ENCOUNTER — Other Ambulatory Visit: Payer: Self-pay

## 2019-07-31 ENCOUNTER — Encounter (HOSPITAL_COMMUNITY): Payer: Self-pay | Admitting: *Deleted

## 2019-07-31 ENCOUNTER — Emergency Department (HOSPITAL_COMMUNITY)
Admission: EM | Admit: 2019-07-31 | Discharge: 2019-07-31 | Disposition: A | Discharge status: Home / Self Care | Payer: Managed Care, Other (non HMO) | Attending: Emergency Medicine | Admitting: Emergency Medicine

## 2019-07-31 DIAGNOSIS — Z79899 Other long term (current) drug therapy: Secondary | ICD-10-CM | POA: Diagnosis not present

## 2019-07-31 DIAGNOSIS — I10 Essential (primary) hypertension: Secondary | ICD-10-CM | POA: Diagnosis not present

## 2019-07-31 DIAGNOSIS — Z20828 Contact with and (suspected) exposure to other viral communicable diseases: Secondary | ICD-10-CM | POA: Insufficient documentation

## 2019-07-31 DIAGNOSIS — Z20822 Contact with and (suspected) exposure to covid-19: Secondary | ICD-10-CM

## 2019-07-31 DIAGNOSIS — E119 Type 2 diabetes mellitus without complications: Secondary | ICD-10-CM | POA: Diagnosis not present

## 2019-07-31 NOTE — ED Provider Notes (Signed)
[place] DEPT Provider Note   CSN: [id number] Arrival date & time: 07/31/19  1702     History   Chief Complaint Chief Complaint  Patient presents with   Exposure to Covid    HPI RAFAELA DINIUS is a 58 y.o. female.  Presents emergency department after COVID-19 exposure.  Patient's mother tested positive for COVID today.  Yesterday she drove her mother in the car for couple hours.  States mother was coughing while she was in the car.  Ms. Neoma Laming has not had any symptoms, specifically denies chest pain, difficulty breathing, fever, myalgias, headaches, sore throat, diarrhea, nausea.  She states she wants to be tested for covid.    HPI  Past Medical History:  Diagnosis Date   Anxiety    Arthritis    Carpal tunnel syndrome    Diabetes mellitus without complication (HCC)    GERD (gastroesophageal reflux disease)    Hypertension     Patient Active Problem List   Diagnosis Date Noted   Arthritis of knee 02/24/2018    Past Surgical History:  Procedure Laterality Date   ABDOMINAL HYSTERECTOMY     CESAREAN SECTION     COLONOSCOPY     DILATION AND CURETTAGE OF UTERUS     JOINT REPLACEMENT     TOTAL KNEE ARTHROPLASTY Left 02/24/2018   TOTAL KNEE ARTHROPLASTY Left 02/24/2018   Procedure: LEFT TOTAL KNEE ARTHROPLASTY;  Surgeon: Meredith Pel, MD;  Location: [place];  Service: Orthopedics;  Laterality: Left;     OB History   No obstetric history on file.      Home Medications    Prior to Admission medications   Medication Sig Start Date End Date Taking? Authorizing Provider  acetaminophen (TYLENOL) 500 MG tablet Take 500 mg by mouth 2 (two) times daily as needed for moderate pain.    [provider]  ALPRAZolam Duanne Moron) 0.5 MG tablet Take 0.5 mg by mouth daily. May take a second 0.5 mg dose as needed for anxiety 03/17/17   [provider]  amLODipine (NORVASC) 10 MG tablet Take 10 mg by mouth daily. 03/05/17    [provider]  amoxicillin (AMOXIL) 500 MG capsule TAKE 4 CAPSULES 1 HR PRIOR TO DENTAL PROCEDURE 02/08/19   Meredith Pel, MD  amoxicillin (AMOXIL) 500 MG tablet Take 2 grams 1 hour prior to dental procedure 04/09/18   Meredith Pel, MD  Cholecalciferol (VITAMIN D) 2000 units tablet Take 2,000 Units by mouth daily.    [provider]  CONTOUR NEXT TEST test strip USE TO CHECK BLOOD SUGARS IN THE MORNING WHEN FASTING AND AS NEEDED 07/23/17   [provider]  Cyanocobalamin 1500 MCG TBDP Take 1,500 mcg by mouth daily.    [provider]  cyclobenzaprine (FLEXERIL) 10 MG tablet Take 10 mg by mouth up to 3 times daily as needed for muscle spasms 02/06/17   [provider]  docusate sodium (COLACE) 100 MG capsule Take 1 capsule (100 mg total) by mouth 2 (two) times daily. 02/26/18   Meredith Pel, MD  FLUoxetine (PROZAC) 20 MG capsule Take 20 mg by mouth at bedtime.  03/13/17   [provider]  HYDROcodone-acetaminophen (NORCO/VICODIN) 5-325 MG tablet 1 po q 8-12 hrs prn pain 05/05/18   Meredith Pel, MD  losartan (COZAAR) 50 MG tablet Take 50 mg by mouth daily. 02/24/17   [provider]  metFORMIN (GLUCOPHAGE) 500 MG tablet Take 250-500 mg by mouth See  admin instructions. Take 500 mg in the morning and 250 mg at night 03/06/17   [provider]  metoprolol succinate (TOPROL-XL) 25 MG 24 hr tablet Take 25 mg by mouth daily.    [provider]  omeprazole (PRILOSEC) 20 MG capsule TAKE 1 TABLET BY MOUTH ONCE A DAY FOR 90 DAYS 02/22/17   [provider]  oxyCODONE (ROXICODONE) 5 MG immediate release tablet 1 po q 8hrs prn pain 03/12/18   Cammy Copaean, Gregory Scott, MD  oxyCODONE (ROXICODONE) 5 MG immediate release tablet 1 PO Q 4HRS PRN PAIN 03/25/18   Cammy Copaean, Gregory Scott, MD  rivaroxaban (XARELTO) 10 MG TABS tablet Take 1 tablet (10 mg total) by mouth daily with breakfast. 02/27/18   Cammy Copaean, Gregory Scott, MD    Tetrahydrozoline HCl (VISINE OP) Place 2 drops into both eyes daily as needed (dry eyes).    [provider]    Family History No family history on file.  Social History Social History   Tobacco Use   Smoking status: Never Smoker   Smokeless tobacco: Never Used  Substance Use Topics   Alcohol use: Yes    Alcohol/week: 17.0 standard drinks    Types: 7 Cans of beer, 10 Shots of liquor per week    Comment: 1-2 glasses week of wine   Drug use: No     Allergies   Ace inhibitors, Lisinopril, and Keflex [cephalexin]   Review of Systems Review of Systems  Constitutional: Negative for chills and fever.  HENT: Negative for ear pain and sore throat.   Eyes: Negative for pain and visual disturbance.  Respiratory: Negative for cough and shortness of breath.   Cardiovascular: Negative for chest pain and palpitations.  Gastrointestinal: Negative for abdominal pain and vomiting.  Genitourinary: Negative for dysuria and hematuria.  Musculoskeletal: Negative for arthralgias and back pain.  Skin: Negative for color change and rash.  Neurological: Negative for seizures and syncope.  All other systems reviewed and are negative.    Physical Exam Updated Vital Signs BP (!) 156/101 (BP Location: Right Arm)    Pulse 100    Temp 97.6 F (36.4 C) (Oral)    Resp 18    Ht 5\' 3"  (1.6 m)    SpO2 100%    BMI 26.27 kg/m   Physical Exam Vitals signs and nursing note reviewed.  Constitutional:      General: She is not in acute distress.    Appearance: She is well-developed.  HENT:     Head: Normocephalic and atraumatic.  Eyes:     Conjunctiva/sclera: Conjunctivae normal.  Neck:     Musculoskeletal: Neck supple.  Cardiovascular:     Rate and Rhythm: Normal rate and regular rhythm.     Pulses: Normal pulses.  Pulmonary:     Effort: Pulmonary effort is normal. No respiratory distress.  Abdominal:     Palpations: Abdomen is soft.     Tenderness: There is no abdominal  tenderness.  Skin:    General: Skin is warm and dry.     Capillary Refill: Capillary refill takes less than 2 seconds.  Neurological:     General: No focal deficit present.     Mental Status: She is alert and oriented to person, place, and time.  Psychiatric:        Mood and Affect: Mood normal.        Behavior: Behavior normal.      ED Treatments / Results  Labs (all labs ordered are listed, but only  abnormal results are displayed) Labs Reviewed  NOVEL CORONAVIRUS, NAA (HOSPITAL ORDER, SEND-OUT TO REF LAB)    EKG None  Radiology No results found.  Procedures Procedures (including critical care time)  Medications Ordered in ED Medications - No data to display   Initial Impression / Assessment and Plan / ED Course  I have reviewed the triage vital signs and the nursing notes.  Pertinent labs & imaging results that were available during my care of the patient were reviewed by me and considered in my medical decision making (see chart for details).        58 year old lady who had significant exposure to her mother who tested positive for COVID-19.  Will send for COVID send out test.  Well-appearing asymptomatic with normal vital signs.  Discussed isolation precautions, discussed possibility of false negative given patient's exposure was only yesterday.  Reviewed return precautions, will discharge home.    After the discussed management above, the patient was determined to be safe for discharge.  The patient was in agreement with this plan and all questions regarding their care were answered.  ED return precautions were discussed and the patient will return to the ED with any significant worsening of condition.   Final Clinical Impressions(s) / ED Diagnoses   Final diagnoses:  Exposure to Covid-19 Virus    ED Discharge Orders    None       Milagros Lollykstra, Atom Solivan S, MD 07/31/19 [phone]

## 2019-07-31 NOTE — Discharge Instructions (Signed)
Recommend following isolation precautions as discussed.  Recommend returning to ER if you develop difficulty breathing.  Recommend Tylenol, Motrin as needed for fevers.

## 2019-07-31 NOTE — ED Triage Notes (Signed)
Pt brought mother home to her house yesterday, drove 2 hours in car, She tested positive for Covid, pt wants to be tested and other relatives due to being around pt yesterday and today. Pt is asymptomatic

## 2019-08-02 LAB — NOVEL CORONAVIRUS, NAA (HOSP ORDER, SEND-OUT TO REF LAB; TAT 18-24 HRS): SARS-CoV-2, NAA: NOT DETECTED

## 2020-01-25 DIAGNOSIS — Z20828 Contact with and (suspected) exposure to other viral communicable diseases: Secondary | ICD-10-CM | POA: Diagnosis not present

## 2020-02-05 ENCOUNTER — Ambulatory Visit: Payer: Self-pay | Attending: Internal Medicine

## 2020-02-05 DIAGNOSIS — Z23 Encounter for immunization: Secondary | ICD-10-CM | POA: Insufficient documentation

## 2020-02-05 NOTE — Progress Notes (Signed)
   Covid-19 Vaccination Clinic  Name:  Raven Shaw    MRN: [id number] DOB: January 28, 1961  02/05/2020  Raven Shaw was observed post Covid-19 immunization for 15 minutes without incidence. She was provided with Vaccine Information Sheet and instruction to access the V-Safe system.   Raven Shaw was instructed to call 911 with any severe reactions post vaccine: Marland Kitchen Difficulty breathing  . Swelling of your face and throat  . A fast heartbeat  . A bad rash all over your body  . Dizziness and weakness    Immunizations Administered    Name Date Dose VIS Date Route   Pfizer COVID-19 Vaccine 02/05/2020 10:42 AM 0.3 mL 11/19/2019 Intramuscular   Manufacturer: [employer], [employer]   Lot: [id number]   NDC: [address]-7

## 2020-02-26 ENCOUNTER — Ambulatory Visit: Payer: Self-pay | Attending: Internal Medicine

## 2020-02-26 DIAGNOSIS — Z23 Encounter for immunization: Secondary | ICD-10-CM

## 2020-02-26 NOTE — Progress Notes (Signed)
   Covid-19 Vaccination Clinic  Name:  Raven Shaw    MRN: [id number] DOB: March 13, 1961  02/26/2020  [name] was observed post Covid-19 immunization for 15 minutes without incident. She was provided with Vaccine Information Sheet and instruction to access the V-Safe system.   [name] was instructed to call 911 with any severe reactions post vaccine: Marland Kitchen Difficulty breathing  . Swelling of face and throat  . A fast heartbeat  . A bad rash all over body  . Dizziness and weakness   Immunizations Administered    Name Date Dose VIS Date Route   Pfizer COVID-19 Vaccine 02/26/2020 10:37 AM 0.3 mL 11/19/2019 Intramuscular   Manufacturer: [employer], [employer]   Lot: [id number]   NDC: [address]-3

## 2020-03-01 ENCOUNTER — Ambulatory Visit: Payer: Self-pay

## 2020-04-14 DIAGNOSIS — I10 Essential (primary) hypertension: Secondary | ICD-10-CM | POA: Diagnosis not present

## 2020-04-14 DIAGNOSIS — M109 Gout, unspecified: Secondary | ICD-10-CM | POA: Diagnosis not present

## 2020-04-14 DIAGNOSIS — E1169 Type 2 diabetes mellitus with other specified complication: Secondary | ICD-10-CM | POA: Diagnosis not present

## 2020-04-14 DIAGNOSIS — E785 Hyperlipidemia, unspecified: Secondary | ICD-10-CM | POA: Diagnosis not present

## 2020-05-10 DIAGNOSIS — E119 Type 2 diabetes mellitus without complications: Secondary | ICD-10-CM | POA: Diagnosis not present

## 2020-05-11 DIAGNOSIS — Z03818 Encounter for observation for suspected exposure to other biological agents ruled out: Secondary | ICD-10-CM | POA: Diagnosis not present

## 2020-08-29 DIAGNOSIS — Z23 Encounter for immunization: Secondary | ICD-10-CM | POA: Diagnosis not present

## 2020-12-04 DIAGNOSIS — E785 Hyperlipidemia, unspecified: Secondary | ICD-10-CM | POA: Diagnosis not present

## 2020-12-04 DIAGNOSIS — R946 Abnormal results of thyroid function studies: Secondary | ICD-10-CM | POA: Diagnosis not present

## 2020-12-04 DIAGNOSIS — Z23 Encounter for immunization: Secondary | ICD-10-CM | POA: Diagnosis not present

## 2020-12-04 DIAGNOSIS — E119 Type 2 diabetes mellitus without complications: Secondary | ICD-10-CM | POA: Diagnosis not present

## 2020-12-04 DIAGNOSIS — Z0000 Encounter for general adult medical examination without abnormal findings: Secondary | ICD-10-CM | POA: Diagnosis not present

## 2020-12-18 DIAGNOSIS — Z01419 Encounter for gynecological examination (general) (routine) without abnormal findings: Secondary | ICD-10-CM | POA: Diagnosis not present

## 2020-12-18 DIAGNOSIS — Z6825 Body mass index (BMI) 25.0-25.9, adult: Secondary | ICD-10-CM | POA: Diagnosis not present

## 2021-01-31 DIAGNOSIS — Z1231 Encounter for screening mammogram for malignant neoplasm of breast: Secondary | ICD-10-CM | POA: Diagnosis not present

## 2021-05-09 DEATH — deceased
